# Patient Record
Sex: Female | Born: 1975 | Race: Black or African American | Hispanic: No | Marital: Single | Smoking: Never smoker
Health system: Southern US, Community
[De-identification: ages and names within clinical notes are randomized; demographics above are authoritative.]

## PROBLEM LIST (undated history)

## (undated) DIAGNOSIS — A749 Chlamydial infection, unspecified: Secondary | ICD-10-CM

## (undated) DIAGNOSIS — Z789 Other specified health status: Secondary | ICD-10-CM

## (undated) DIAGNOSIS — J45909 Unspecified asthma, uncomplicated: Secondary | ICD-10-CM

---

## 1998-03-30 ENCOUNTER — Inpatient Hospital Stay (HOSPITAL_COMMUNITY): Admission: AD | Admit: 1998-03-30 | Discharge: 1998-03-30 | Payer: Self-pay | Admitting: Obstetrics

## 1998-06-29 ENCOUNTER — Inpatient Hospital Stay (HOSPITAL_COMMUNITY): Admission: AD | Admit: 1998-06-29 | Discharge: 1998-06-29 | Payer: Self-pay | Admitting: Obstetrics

## 1999-09-06 ENCOUNTER — Encounter: Payer: Self-pay | Admitting: *Deleted

## 1999-09-06 ENCOUNTER — Ambulatory Visit (HOSPITAL_COMMUNITY): Admission: RE | Admit: 1999-09-06 | Discharge: 1999-09-06 | Payer: Self-pay | Admitting: *Deleted

## 1999-12-28 ENCOUNTER — Inpatient Hospital Stay (HOSPITAL_COMMUNITY): Admission: AD | Admit: 1999-12-28 | Discharge: 1999-12-28 | Payer: Self-pay | Admitting: *Deleted

## 2000-01-20 ENCOUNTER — Inpatient Hospital Stay (HOSPITAL_COMMUNITY): Admission: AD | Admit: 2000-01-20 | Discharge: 2000-01-22 | Payer: Self-pay | Admitting: *Deleted

## 2000-01-20 ENCOUNTER — Inpatient Hospital Stay (HOSPITAL_COMMUNITY): Admission: AD | Admit: 2000-01-20 | Discharge: 2000-01-20 | Payer: Self-pay | Admitting: *Deleted

## 2000-07-15 ENCOUNTER — Emergency Department (HOSPITAL_COMMUNITY): Admission: EM | Admit: 2000-07-15 | Discharge: 2000-07-15 | Payer: Self-pay | Admitting: Emergency Medicine

## 2000-10-21 ENCOUNTER — Encounter: Payer: Self-pay | Admitting: Obstetrics

## 2000-10-21 ENCOUNTER — Inpatient Hospital Stay (HOSPITAL_COMMUNITY): Admission: AD | Admit: 2000-10-21 | Discharge: 2000-10-21 | Payer: Self-pay | Admitting: Obstetrics

## 2000-11-19 ENCOUNTER — Inpatient Hospital Stay (HOSPITAL_COMMUNITY): Admission: AD | Admit: 2000-11-19 | Discharge: 2000-11-19 | Payer: Self-pay | Admitting: Obstetrics

## 2001-01-25 ENCOUNTER — Encounter: Payer: Self-pay | Admitting: Obstetrics

## 2001-01-25 ENCOUNTER — Inpatient Hospital Stay (HOSPITAL_COMMUNITY): Admission: AD | Admit: 2001-01-25 | Discharge: 2001-01-25 | Payer: Self-pay | Admitting: Obstetrics

## 2001-02-10 ENCOUNTER — Inpatient Hospital Stay (HOSPITAL_COMMUNITY): Admission: AD | Admit: 2001-02-10 | Discharge: 2001-02-10 | Payer: Self-pay | Admitting: Obstetrics

## 2001-03-03 ENCOUNTER — Inpatient Hospital Stay (HOSPITAL_COMMUNITY): Admission: AD | Admit: 2001-03-03 | Discharge: 2001-03-03 | Payer: Self-pay | Admitting: Obstetrics

## 2001-03-09 ENCOUNTER — Inpatient Hospital Stay (HOSPITAL_COMMUNITY): Admission: AD | Admit: 2001-03-09 | Discharge: 2001-03-09 | Payer: Self-pay | Admitting: Obstetrics

## 2001-03-10 ENCOUNTER — Encounter (INDEPENDENT_AMBULATORY_CARE_PROVIDER_SITE_OTHER): Payer: Self-pay | Admitting: Specialist

## 2001-03-10 ENCOUNTER — Inpatient Hospital Stay (HOSPITAL_COMMUNITY): Admission: AD | Admit: 2001-03-10 | Discharge: 2001-03-12 | Payer: Self-pay | Admitting: Obstetrics

## 2001-10-20 ENCOUNTER — Emergency Department (HOSPITAL_COMMUNITY): Admission: EM | Admit: 2001-10-20 | Discharge: 2001-10-20 | Payer: Self-pay | Admitting: Emergency Medicine

## 2002-12-13 ENCOUNTER — Encounter: Payer: Self-pay | Admitting: Family Medicine

## 2002-12-13 ENCOUNTER — Inpatient Hospital Stay (HOSPITAL_COMMUNITY): Admission: AD | Admit: 2002-12-13 | Discharge: 2002-12-13 | Payer: Self-pay | Admitting: Family Medicine

## 2003-03-25 ENCOUNTER — Inpatient Hospital Stay (HOSPITAL_COMMUNITY): Admission: AD | Admit: 2003-03-25 | Discharge: 2003-03-26 | Payer: Self-pay | Admitting: Obstetrics

## 2003-04-05 ENCOUNTER — Inpatient Hospital Stay (HOSPITAL_COMMUNITY): Admission: AD | Admit: 2003-04-05 | Discharge: 2003-04-05 | Payer: Self-pay | Admitting: Obstetrics

## 2003-04-07 ENCOUNTER — Inpatient Hospital Stay (HOSPITAL_COMMUNITY): Admission: AD | Admit: 2003-04-07 | Discharge: 2003-04-09 | Payer: Self-pay | Admitting: Obstetrics

## 2004-10-29 ENCOUNTER — Inpatient Hospital Stay (HOSPITAL_COMMUNITY): Admission: AD | Admit: 2004-10-29 | Discharge: 2004-10-29 | Payer: Self-pay | Admitting: *Deleted

## 2005-04-15 ENCOUNTER — Inpatient Hospital Stay (HOSPITAL_COMMUNITY): Admission: AD | Admit: 2005-04-15 | Discharge: 2005-04-15 | Payer: Self-pay | Admitting: *Deleted

## 2005-05-21 ENCOUNTER — Inpatient Hospital Stay (HOSPITAL_COMMUNITY): Admission: AD | Admit: 2005-05-21 | Discharge: 2005-05-21 | Payer: Self-pay | Admitting: *Deleted

## 2005-06-18 ENCOUNTER — Inpatient Hospital Stay (HOSPITAL_COMMUNITY): Admission: AD | Admit: 2005-06-18 | Discharge: 2005-06-18 | Payer: Self-pay | Admitting: *Deleted

## 2005-08-26 ENCOUNTER — Inpatient Hospital Stay (HOSPITAL_COMMUNITY): Admission: AD | Admit: 2005-08-26 | Discharge: 2005-08-30 | Payer: Self-pay | Admitting: Obstetrics

## 2005-08-29 ENCOUNTER — Encounter (INDEPENDENT_AMBULATORY_CARE_PROVIDER_SITE_OTHER): Payer: Self-pay | Admitting: Specialist

## 2006-04-09 HISTORY — PX: TUBAL LIGATION: SHX77

## 2006-07-24 ENCOUNTER — Inpatient Hospital Stay (HOSPITAL_COMMUNITY): Admission: AD | Admit: 2006-07-24 | Discharge: 2006-07-24 | Payer: Self-pay | Admitting: Obstetrics

## 2006-09-16 ENCOUNTER — Emergency Department (HOSPITAL_COMMUNITY): Admission: EM | Admit: 2006-09-16 | Discharge: 2006-09-16 | Payer: Self-pay | Admitting: Family Medicine

## 2006-09-23 ENCOUNTER — Ambulatory Visit: Payer: Self-pay | Admitting: Obstetrics and Gynecology

## 2006-09-23 ENCOUNTER — Inpatient Hospital Stay (HOSPITAL_COMMUNITY): Admission: AD | Admit: 2006-09-23 | Discharge: 2006-09-23 | Payer: Self-pay | Admitting: Family Medicine

## 2006-10-02 ENCOUNTER — Inpatient Hospital Stay (HOSPITAL_COMMUNITY): Admission: AD | Admit: 2006-10-02 | Discharge: 2006-10-02 | Payer: Self-pay | Admitting: Obstetrics

## 2006-10-06 ENCOUNTER — Inpatient Hospital Stay (HOSPITAL_COMMUNITY): Admission: AD | Admit: 2006-10-06 | Discharge: 2006-10-06 | Payer: Self-pay | Admitting: Obstetrics

## 2006-10-10 ENCOUNTER — Inpatient Hospital Stay (HOSPITAL_COMMUNITY): Admission: AD | Admit: 2006-10-10 | Discharge: 2006-10-10 | Payer: Self-pay | Admitting: Obstetrics

## 2006-10-10 ENCOUNTER — Ambulatory Visit (HOSPITAL_COMMUNITY): Admission: RE | Admit: 2006-10-10 | Discharge: 2006-10-10 | Payer: Self-pay | Admitting: Obstetrics

## 2006-10-12 ENCOUNTER — Inpatient Hospital Stay (HOSPITAL_COMMUNITY): Admission: AD | Admit: 2006-10-12 | Discharge: 2006-10-12 | Payer: Self-pay | Admitting: Obstetrics

## 2006-10-15 ENCOUNTER — Encounter: Payer: Self-pay | Admitting: Obstetrics

## 2006-10-15 ENCOUNTER — Inpatient Hospital Stay (HOSPITAL_COMMUNITY): Admission: AD | Admit: 2006-10-15 | Discharge: 2006-10-15 | Payer: Self-pay | Admitting: Obstetrics

## 2006-10-18 ENCOUNTER — Inpatient Hospital Stay (HOSPITAL_COMMUNITY): Admission: AD | Admit: 2006-10-18 | Discharge: 2006-10-18 | Payer: Self-pay | Admitting: Obstetrics and Gynecology

## 2006-10-21 ENCOUNTER — Inpatient Hospital Stay (HOSPITAL_COMMUNITY): Admission: AD | Admit: 2006-10-21 | Discharge: 2006-10-25 | Payer: Self-pay | Admitting: Obstetrics and Gynecology

## 2006-10-22 ENCOUNTER — Encounter (INDEPENDENT_AMBULATORY_CARE_PROVIDER_SITE_OTHER): Payer: Self-pay | Admitting: Obstetrics

## 2010-04-30 ENCOUNTER — Encounter: Payer: Self-pay | Admitting: Obstetrics

## 2010-08-22 NOTE — Op Note (Signed)
[name], Madison Castillo NO.:  [id number]   MEDICAL RECORD NO.:  [id number]          PATIENT TYPE:  INP   LOCATION:  9124                          [facility]ILITY:  [facility]   PHYSICIAN:  Kathreen Cosier, M.D.DATE OF BIRTH:  November 11, 1975   DATE OF PROCEDURE:  10/22/2006  DATE OF DISCHARGE:                               OPERATIVE REPORT   PREOPERATIVE DIAGNOSIS:  Normal vaginal delivery of twin A and C-section  for delivery of twin B.   SURGEON:  Dr. Gaynell Face   ANESTHESIA:  Epidural.   FIRST ASSISTANT:  Dr. Clearance Coots.   PROCEDURE:  The patient placed on the operating table in supine  position.  Abdomen prepped and draped, bladder emptied with Foley  catheter.  Transverse suprapubic incision made carried down to rectus  fascia. Fascia cleanly incised length of incision.  Recti muscles  retracted laterally.  Peritoneum incised longitudinally.  Transverse  incision made in the visceral peritoneum above the bladder mobilized  inferiorly.  Transverse lower uterine incision made and the patient was  delivered from vertex. The head was hyperextended and she had a female  Apgars 7 and 8 weighing 5 pounds 11 ounces at 10:30 a.m.  Twin A  delivered at 9:04 a.m. vaginally.  Placenta was removed and sent to  pathology.  There appeared to be an abruption of placenta.  The team was  in attendance and fluid was clear.  Uterine cavity cleaned with dry  laps.  Uterine incision closed in one layer with continuous suture of #1  chromic.  Hemostasis satisfactory.  Bladder flap reattached 2-0 chromic.  The right tube grasped in midportion with Babcock clamps and 0 plain  suture placed in the mesosalpinx below the portion of tube within clamp.  This was tied and approximately 1 inch tube transected.  The procedure  done in a similar fashion on other side.  Lap and sponge counts correct.  Blood loss 600 mL.  Abdomen closed in layers, peritoneum continuous  suture of 0 chromic, fascia continuous  suture of 0 Dexon and the  subcutaneous tissue closed with to 3-0 plain. Skin closed with staples.  The patient tolerated the procedure well and taken to recovery room in  good condition.           ______________________________  Kathreen Cosier, M.D.     BAM/MEDQ  D:  10/22/2006  T:  10/22/2006  Job:  [id number]

## 2010-08-22 NOTE — H&P (Signed)
NAMEEMMELY, BITTINGER                ACCOUNT NO.:  [id number]   MEDICAL RECORD NO.:  [id number]          PATIENT TYPE:  INP   LOCATION:  9164                          [facility]  [facility]   PHYSICIAN:  Roseanna Rainbow, M.D.DATE OF BIRTH:  Sep 30, 1975   DATE OF ADMISSION:  10/21/2006  DATE OF DISCHARGE:                              HISTORY & PHYSICAL   CHIEF COMPLAINT:  The patient is a 35 year old gravida 7, para 5 with an  estimated date of confinement of November 16, 2006 with a twin gestation at  36+ weeks, complaining of contractions and possible rupture of  membranes.   HISTORY OF PRESENT ILLNESS:  Please see the above.   PAST GYNECOLOGICAL HISTORY:  Noncontributory.   PAST OBSTETRICAL HISTORY:  1. In 1995, she was delivered of a liveborn female, 7 pounds 5 ounces,      full-term vaginal delivery, no complications.  2. In 1996, she was delivered of a 5-pound 2-ounce infant, full-term      vaginal delivery.  In 2001, she was delivered of a 7-pound 3-once      infant, vaginal delivery, no complications.  3. In 2002, she had a twin gestation, both 5-pound 2-ounce infants, a      vaginal delivery.  4. In 2007, she had a second trimester spontaneous abortion.   PAST MEDICAL HISTORY:  She denies.   FAMILY HISTORY:  Noncontributory.   PAST SURGICAL HISTORY:  She denies.   SOCIAL HISTORY:  She is single.  She gives a history of tobacco use, but  discontinued during the pregnancy.  She denies any alcohol or substance  abuse.   OBSTETRICAL RISK FACTORS:  Twin gestation, obesity, GBS positive,  history of IUFD and preterm deliveries.   PRENATAL SCREENS:  Blood type B-positive, antibody screen negative.  RPR  nonreactive.  Rubella immune.  Hepatitis B surface antigen negative.  GBS positive.  HIV nonreactive.   On ultrasound today, the twins are vertex/vertex presentation with  normal amniotic fluid indices x2.  On ultrasound on July 3 at 34+ weeks,  fetus A was the 24th percentile  for estimated fetal weight; fetus B was  56th percentile; they are dichorionic female/female.   PHYSICAL EXAMINATION:  VITAL SIGNS:  Blood pressure is 140s to 150s over  70s to 90s.  Fetal heart tracings reassuring x2.  Tocodynamometer:  Irregular uterine contractions.  GENERAL:  No acute distress.  ABDOMEN:  Gravid.  PELVIC:  On sterile vaginal exam, cervix is 3- to 4-cm dilated, 50%  effaced.   LABORATORY DATA:  Remarkable for a uric acid of 7.2.   ASSESSMENT:  Twin gestation at 30 weeks with rule out pregnancy-induced  hypertension, prodromal labor, fetal heart tracings consistent with  fetal well-being.   PLAN:  Admission, expectant management for now, possible augmentation of  labor.      Roseanna Rainbow, M.D.  Electronically Signed     LAJ/MEDQ  D:  10/22/2006  T:  10/22/2006  Job:  [id number]

## 2010-08-22 NOTE — H&P (Signed)
NAMERIA, REDCAY NO.:  [id number]   MEDICAL RECORD NO.:  [id number]          PATIENT TYPE:  INP   LOCATION:  9124                          [facility]  [facility]   PHYSICIAN:  Kathreen Cosier, M.D.DATE OF BIRTH:  1976/02/06   DATE OF ADMISSION:  10/21/2006  DATE OF DISCHARGE:                              HISTORY & PHYSICAL   HISTORY OF PRESENT ILLNESS:  The patient is a 35 year old gravida 7,  para 2-3-1-5 , pregnant with twins. [facility] November 16, 2006. Very late  prenatal care. She came in in labor, 4 cm, 100%, then contracting  irregularly. By 7:00 a.m. on October 21, 2005, cervix was 6 cm, 100%. Twin  A vertex, minus 2 station. Membranes ruptured artificially. Fluid was  clear. Twin B by recent ultrasound was also vertex. While hospitalized,  her diastolic blood pressures ranged between 89 and 93. Uric acid was 7+  and she has had 4+ edema, 30 of protein, 1+ reflexes. She was started on  magnesium sulfate on October 22, 2006 at 7:00 a.m. She progressed rapidly  and had a normal vaginal delivery of twin A at 9:04 a.m., 4 pounds 11  ounces, with Apgar's of 9 and 9. Twin B was a vertex and the vertex at  minus 2 station. Membranes ruptured artificially. Fluid clear. Fetal  heart remained normal. She tried to push for 4 to 5 minutes. There was  no descent between below minus 2 station. The patient refused to push  and no descent at vertex below minus 2 station. She demanded C-section  and she was also scheduled for tubal ligation.   PHYSICAL EXAMINATION:  GENERAL:  Revealed a massively obese female in  labor.  HEENT:  Negative.  LUNGS:  Clear.  HEART:  Regular rhythm. No murmur, rub, or gallop present.  ABDOMEN:  No masses. Distended with twin gestation.  EXTREMITIES:  4+ edema.           ______________________________  Kathreen Cosier, M.D.     BAM/MEDQ  D:  10/22/2006  T:  10/22/2006  Job:  [id number]

## 2010-08-25 NOTE — Discharge Summary (Signed)
[name], Madison Castillo                ACCOUNT NO.:  [id number]   MEDICAL RECORD NO.:  [id number]          PATIENT TYPE:  INP   LOCATION:  9304                          [facility]   PHYSICIAN:  Kathreen Cosier, M.D.DATE OF BIRTH:  [birth year]   DATE OF ADMISSION:  08/26/2005  DATE OF DISCHARGE:  08/30/2005                                 DISCHARGE SUMMARY   Patient is a 35 year old gravida 6, para 2-3-0-5 [redacted] weeks gestation by  ultrasound.  On examination her uterus is 22 weeks size and she was admitted  with premature contractions and a cervix 1 cm, 80% with membranes intact.  Because of the size of the uterus an attempt was made to stop her  contractions.  She was placed on magnesium sulfate and ampicillin 2 g IV  every six hours.  However, by May 22 she started having foul smelling lochia  and passed the fetus on May 23.  The placenta was retained and 400 mcg of  Cytotec was placed in the vagina.  The placenta was removed intact from the  cervix digitally and post delivery hemoglobin was 8.4.  She was discharged  home on May 24 on Motrin 800 t.i.d., ampicillin, and iron and she had a  second trimester abortion of a female weighing 8 ounces and the Apgars were  1, 1, and 1.           ______________________________  Kathreen Cosier, M.D.     BAM/MEDQ  D:  09/19/2005  T:  09/19/2005  Job:  [id number]

## 2010-08-25 NOTE — Discharge Summary (Signed)
Madison Castillo, Madison Castillo NO.:  [id number]   MEDICAL RECORD NO.:  [id number]          PATIENT TYPE:  INP   LOCATION:  9124                          [facility]   PHYSICIAN:  Kathreen Cosier, M.D.DATE OF BIRTH:  06-23-1975   DATE OF ADMISSION:  10/21/2006  DATE OF DISCHARGE:  10/25/2006                               DISCHARGE SUMMARY   This is a 35 year old gravida 7, para 2-2-1-5, EDC of 11/16/06.  Late  prenatal care with twins, in labor.  Cervix 6 cm, 100%.  Twin A vertex,  -2.  Membranes ruptured artificially, fluid clear.  Twin B was a vertex  on ultrasound prior to admission.  Diastolic blood pressures were 04-54.  Uric acid 7+.  She was started on magnesium sulfate 4 grams, lower to 2  grams an hour.  She had 4+ edema and 1+ reflexes.   Twin A delivered normal vaginal delivery, female, Apgar 9/9, 4 pounds 11  ounces.  Twin B was a vertex.  The patient pushed for 45 minutes with no  descent, low -2 station.  Then the patient decided she was not going to  push any further and demanded a C-section.  She underwent a  low  transverse cesarean section for Twin B, had a female, Apgars 7/8, weighing  5 pounds 11 ounces.  The head was hyperextended.   Postoperatively she did well.  Her hemoglobin was 7.  She was placed on  hydrochlorothiazide 25 mg by mouth daily for her edema.  She was  asymptomatic with her hemoglobin of 7, and she was placed on ferrous  sulfate twice a day.  She was discharged home on the third postoperative  day on Tylox for pain and ferrous sulfate 1 by mouth twice a day.   DISCHARGE DIAGNOSES:  1. Status post intrauterine pregnancy, twin gestation.  Twin A,      vaginal; Twin B, cesarean section.  2. Tubal ligation.           ______________________________  Kathreen Cosier, M.D.     BAM/MEDQ  D:  11/20/2006  T:  11/21/2006  Job:  [id number]

## 2010-10-23 ENCOUNTER — Encounter (HOSPITAL_COMMUNITY): Payer: Self-pay | Admitting: *Deleted

## 2010-10-23 ENCOUNTER — Inpatient Hospital Stay (HOSPITAL_COMMUNITY)
Admission: AD | Admit: 2010-10-23 | Discharge: 2010-10-23 | Disposition: A | Discharge status: Home / Self Care | Payer: Self-pay | Source: Ambulatory Visit | Attending: Obstetrics & Gynecology | Admitting: Obstetrics & Gynecology

## 2010-10-23 DIAGNOSIS — N926 Irregular menstruation, unspecified: Secondary | ICD-10-CM

## 2010-10-23 DIAGNOSIS — R110 Nausea: Secondary | ICD-10-CM | POA: Insufficient documentation

## 2010-10-23 HISTORY — DX: Chlamydial infection, unspecified: A74.9

## 2010-10-23 LAB — URINALYSIS, ROUTINE W REFLEX MICROSCOPIC
Bilirubin Urine: NEGATIVE
Ketones, ur: NEGATIVE mg/dL
Nitrite: NEGATIVE
Protein, ur: NEGATIVE mg/dL
Specific Gravity, Urine: >1.03 — ABNORMAL HIGH (ref 1.005–1.030)
Urobilinogen, UA: 0.2 mg/dL (ref 0.0–1.0)

## 2010-10-23 MED ORDER — PROMETHAZINE HCL 25 MG PO TABS: 25.0000 mg | ORAL_TABLET | Freq: Four times a day (QID) | ORAL | Status: AC | PRN

## 2010-10-23 NOTE — ED Provider Notes (Addendum)
History    patient is a 35 year old black female is a gravida 7 para 6 AB 1. She presents today complaining of nausea that comes and goes for the past 3 weeks. She also complains of a history of irregular menses. She states she has not had a regular menses in 2 months. She denies any abdominal pain. She denies vaginal discharge, bleeding, fever or any other problems at this time. She states she was mainly concerned that she could be pregnant. She does have a history of bilateral tubal ligation.  Chief Complaint  Patient presents with  . Nausea   HPI  OB History    Grav Para Term Preterm Abortions TAB SAB Ect Mult Living   7 6 5 1 1  0 1 0 1 7      Past Medical History  Diagnosis Date  . Chlamydia     Past Surgical History  Procedure Date  . Cesarean section     Family History  Problem Relation Age of Onset  . Hypertension Mother   . Diabetes Mother   . Hypertension Maternal Grandmother   . Heart disease Maternal Grandmother   . Hypertension Maternal Grandfather   . Heart disease Maternal Grandfather     History  Substance Use Topics  . Smoking status: Former Smoker    Quit date: 01/14/2005  . Smokeless tobacco: Never Used  . Alcohol Use: No    Allergies: No Known Allergies  Prescriptions prior to admission  Medication Sig Dispense Refill  . acetaminophen (TYLENOL) 500 MG tablet Take 1,000 mg by mouth daily as needed. Patient stopped taking the BC's and started taking Tylenol for headaches.       . Aspirin-Salicylamide-Caffeine (BC HEADACHE POWDER PO) Take 2 packets by mouth daily as needed. Patient uses medication for headaches.         Review of Systems  Constitutional: Negative for fever, chills and malaise/fatigue.  Cardiovascular: Negative for chest pain and palpitations.  Gastrointestinal: Positive for nausea. Negative for vomiting, abdominal pain, diarrhea and constipation.  Genitourinary: Negative for dysuria, urgency, frequency, hematuria and flank pain.    Neurological: Negative for dizziness and headaches.  Psychiatric/Behavioral: Negative for depression and suicidal ideas.   Physical Exam   Blood pressure 124/96, pulse 76, temperature 98.3 F (36.8 C), resp. rate 20, height 5\' 3"  (1.6 m), weight 322 lb 3.2 oz (146.149 kg), last menstrual period 07/27/2010, SpO2 97.00%.  Physical Exam  Constitutional: She is oriented to person, place, and time. She appears well-developed and well-nourished. No distress.  HENT:  Head: Normocephalic and atraumatic.  Eyes: EOM are normal. Pupils are equal, round, and reactive to light.  Cardiovascular: Normal rate and regular rhythm.  Exam reveals no gallop and no friction rub.   No murmur heard. Respiratory: Effort normal and breath sounds normal. No respiratory distress. She has no wheezes. She has no rales. She exhibits no tenderness.  GI: Soft. She exhibits no distension. There is no tenderness. There is no rebound and no guarding.  Neurological: She is alert and oriented to person, place, and time.  Skin: Skin is warm and dry. She is not diaphoretic.  Psychiatric: She has a normal mood and affect. Her behavior is normal. Judgment and thought content normal.    MAU Course  Procedures  Results for orders placed during the hospital encounter of 10/23/10 (from the past 24 hour(s))  URINALYSIS, ROUTINE W REFLEX MICROSCOPIC     Status: Abnormal   Collection Time   10/23/10  2:05 PM  Component Value Range   Color, Urine YELLOW  YELLOW    Appearance HAZY (*) CLEAR    Specific Gravity, Urine >1.030 (*) 1.005 - 1.030    pH 6.0  5.0 - 8.0    Glucose, UA NEGATIVE  NEGATIVE (mg/dL)   Hgb urine dipstick NEGATIVE  NEGATIVE    Bilirubin Urine NEGATIVE  NEGATIVE    Ketones, ur NEGATIVE  NEGATIVE (mg/dL)   Protein, ur NEGATIVE  NEGATIVE (mg/dL)   Urobilinogen, UA 0.2  0.0 - 1.0 (mg/dL)   Nitrite NEGATIVE  NEGATIVE    Leukocytes, UA SMALL (*) NEGATIVE   URINE MICROSCOPIC-ADD ON     Status: Abnormal    Collection Time   10/23/10  2:05 PM      Component Value Range   Squamous Epithelial / LPF MANY (*) RARE    WBC, UA 11-20  <3 (WBC/hpf)   Urine-Other MUCOUS PRESENT    POCT PREGNANCY, URINE     Status: Normal   Collection Time   10/23/10  2:25 PM      Component Value Range   Preg Test, Ur NEGATIVE      Assessment and plan: 1) nausea: We'll give the patient a prescription for Phenergan to use when necessary. She will followup with her PCP. I did discuss with her appropriate diet, activities, risks, and precautions. She had no other questions or problems at this time.  Clinton Gallant. Rice III, DrHSc, MPAS, PA-C

## 2010-10-23 NOTE — Progress Notes (Signed)
Been nauseated off and on last 3 wks.  No period in 2+months. Feel week, sleeping too much.

## 2010-10-23 NOTE — Progress Notes (Signed)
Pt states she has been having nausea in the morning and evening for about 3 weeks. Has not had a period in 2 1/2 months. Had a BTL during her Cesarean Section delivery of her last child 4 years ago.

## 2011-01-22 LAB — CBC
MCHC: 33.8
MCV: 78.2
Platelets: 192
RDW: 12.9

## 2011-01-23 LAB — URINALYSIS, ROUTINE W REFLEX MICROSCOPIC
Glucose, UA: NEGATIVE
Glucose, UA: NEGATIVE
Hgb urine dipstick: NEGATIVE
Ketones, ur: NEGATIVE
Leukocytes, UA: NEGATIVE
Nitrite: NEGATIVE
Protein, ur: 30 — AB
Protein, ur: 30 — AB
Specific Gravity, Urine: 1.02
Specific Gravity, Urine: <1.005 — ABNORMAL LOW
Urobilinogen, UA: 1
pH: 6
pH: 6

## 2011-01-23 LAB — TYPE AND SCREEN: ABO/RH(D): B POS

## 2011-01-23 LAB — DIFFERENTIAL
Basophils Relative: 1
Eosinophils Absolute: 0
Eosinophils Relative: 1
Lymphs Abs: 1.3
Monocytes Absolute: 0.2
Neutrophils Relative %: 61

## 2011-01-23 LAB — COMPREHENSIVE METABOLIC PANEL
BUN: 2 — ABNORMAL LOW
Calcium: 8.4
Glucose, Bld: 86
Sodium: 139
Total Protein: 5.4 — ABNORMAL LOW

## 2011-01-23 LAB — CBC
Hemoglobin: 9.2 — ABNORMAL LOW
MCHC: 32.7
MCHC: 32.9
MCV: 80.6
Platelets: 259
RBC: 3.86 — ABNORMAL LOW
RDW: 12.7
RDW: 12.9

## 2011-01-23 LAB — URINE MICROSCOPIC-ADD ON

## 2011-01-23 LAB — SICKLE CELL SCREEN: Sickle Cell Screen: NEGATIVE

## 2011-01-23 LAB — LACTATE DEHYDROGENASE: LDH: 238

## 2011-01-23 LAB — WET PREP, GENITAL
Clue Cells Wet Prep HPF POC: NONE SEEN
Yeast Wet Prep HPF POC: NONE SEEN

## 2011-01-23 LAB — URIC ACID: Uric Acid, Serum: 7.2 — ABNORMAL HIGH

## 2011-01-24 LAB — URINE CULTURE

## 2011-01-24 LAB — CBC
HCT: 28.9 — ABNORMAL LOW
Hemoglobin: 9.6 — ABNORMAL LOW
MCHC: 33.3
RDW: 12.8

## 2011-01-24 LAB — URINE MICROSCOPIC-ADD ON

## 2011-01-24 LAB — RAPID HIV SCREEN (WH-MAU): Rapid HIV Screen: NONREACTIVE

## 2011-01-24 LAB — URINALYSIS, ROUTINE W REFLEX MICROSCOPIC
Bilirubin Urine: NEGATIVE
Glucose, UA: NEGATIVE
Hgb urine dipstick: NEGATIVE
Nitrite: NEGATIVE
Protein, ur: NEGATIVE
Specific Gravity, Urine: 1.01
Specific Gravity, Urine: <1.005 — ABNORMAL LOW
Urobilinogen, UA: 0.2
Urobilinogen, UA: 1
pH: 6.5

## 2011-01-24 LAB — COMPREHENSIVE METABOLIC PANEL
AST: 26
CO2: 31
Calcium: 8.5
Creatinine, Ser: 0.48
GFR calc Af Amer: >60
GFR calc non Af Amer: >60

## 2011-01-24 LAB — DIFFERENTIAL
Basophils Absolute: 0
Basophils Relative: 0
Eosinophils Relative: 2
Lymphocytes Relative: 24
Monocytes Absolute: 0.4

## 2011-01-24 LAB — LACTATE DEHYDROGENASE: LDH: 159

## 2011-01-24 LAB — RAPID URINE DRUG SCREEN, HOSP PERFORMED
Amphetamines: NOT DETECTED
Benzodiazepines: NOT DETECTED

## 2011-01-24 LAB — SICKLE CELL SCREEN: Sickle Cell Screen: NEGATIVE

## 2011-01-24 LAB — GC/CHLAMYDIA PROBE AMP, GENITAL
Chlamydia, DNA Probe: POSITIVE — AB
GC Probe Amp, Genital: NEGATIVE

## 2012-03-03 ENCOUNTER — Inpatient Hospital Stay (HOSPITAL_COMMUNITY): Payer: Self-pay

## 2012-03-03 ENCOUNTER — Inpatient Hospital Stay (HOSPITAL_COMMUNITY)
Admission: AD | Admit: 2012-03-03 | Discharge: 2012-03-03 | Disposition: A | Discharge status: Home / Self Care | Payer: Self-pay | Source: Ambulatory Visit | Attending: Obstetrics | Admitting: Obstetrics

## 2012-03-03 ENCOUNTER — Encounter (HOSPITAL_COMMUNITY): Payer: Self-pay

## 2012-03-03 DIAGNOSIS — N898 Other specified noninflammatory disorders of vagina: Secondary | ICD-10-CM

## 2012-03-03 DIAGNOSIS — R109 Unspecified abdominal pain: Secondary | ICD-10-CM | POA: Insufficient documentation

## 2012-03-03 DIAGNOSIS — N938 Other specified abnormal uterine and vaginal bleeding: Secondary | ICD-10-CM | POA: Insufficient documentation

## 2012-03-03 DIAGNOSIS — N939 Abnormal uterine and vaginal bleeding, unspecified: Secondary | ICD-10-CM

## 2012-03-03 DIAGNOSIS — N949 Unspecified condition associated with female genital organs and menstrual cycle: Secondary | ICD-10-CM | POA: Insufficient documentation

## 2012-03-03 HISTORY — DX: Other specified health status: Z78.9

## 2012-03-03 LAB — URINALYSIS, ROUTINE W REFLEX MICROSCOPIC
Bilirubin Urine: NEGATIVE
Nitrite: NEGATIVE
Specific Gravity, Urine: <1.005 — ABNORMAL LOW (ref 1.005–1.030)
pH: 6 (ref 5.0–8.0)

## 2012-03-03 LAB — WET PREP, GENITAL

## 2012-03-03 LAB — URINE MICROSCOPIC-ADD ON

## 2012-03-03 LAB — CBC
MCV: 82.3 fL (ref 78.0–100.0)
Platelets: 243 10*3/uL (ref 150–400)
RBC: 4.35 MIL/uL (ref 3.87–5.11)
WBC: 5.3 10*3/uL (ref 4.0–10.5)

## 2012-03-03 LAB — POCT PREGNANCY, URINE: Preg Test, Ur: NEGATIVE

## 2012-03-03 MED ORDER — MEDROXYPROGESTERONE ACETATE 10 MG PO TABS: 10.0000 mg | ORAL_TABLET | Freq: Every day | ORAL | Status: DC

## 2012-03-03 MED ORDER — KETOROLAC TROMETHAMINE 60 MG/2ML IM SOLN
60.0000 mg | Freq: Once | INTRAMUSCULAR | Status: AC
  Administered 2012-03-03: 60 mg via INTRAMUSCULAR
  Filled 2012-03-03: qty 2

## 2012-03-03 MED ORDER — IBUPROFEN 800 MG PO TABS: 800.0000 mg | ORAL_TABLET | Freq: Three times a day (TID) | ORAL | Status: DC

## 2012-03-03 NOTE — MAU Note (Signed)
Pt states that she has been having vaginal bleeding with clots for about 3 weeks now. Pt states she is having some abdominal pain as well and has taken multiple medications to help with the pain, all of which none have helped.

## 2012-03-03 NOTE — MAU Provider Note (Signed)
History     CSN: [id number]  Arrival date and time: 03/03/12 1919   None     Chief Complaint  Patient presents with  . Vaginal Bleeding   HPI Madison SESTO is a 36 y.o. female who presents to MAU with vaginal bleeding. The bleeding started over 3 weeks ago.  She describes the bleeding as starting as a period but has increased. Using maxi pad every 30 minutes for the past few days. Passing clots. Associated symptoms include headache, light headed and abdominal cramping that she rates 5/10. Taking Advil and Aleve without results. LNMP 10/02/11 after that no period until bleeding started 3 weeks ago. Never had irregular periods before.  BTL for birth control.  Last pap smear over one year ago and was normal. Current sex partner x 2 years but recently separated. Hx of Chlamydia years ago.    OB History    Grav Para Term Preterm Abortions TAB SAB Ect Mult Living   8 7 3 4 1  1   7       Past Medical History  Diagnosis Date  . No pertinent past medical history     Past Surgical History  Procedure Date  . Cesarean section   . Tubal ligation 2008    Family History  Problem Relation Age of Onset  . Other Neg Hx     History  Substance Use Topics  . Smoking status: Never Smoker   . Smokeless tobacco: Not on file  . Alcohol Use: Yes     Comment: occassional at family functions    Allergies: No Known Allergies  Prescriptions prior to admission  Medication Sig Dispense Refill  . ibuprofen (ADVIL,MOTRIN) 100 MG tablet Take 100 mg by mouth every 6 (six) hours as needed. pain        Review of Systems  Constitutional: Positive for malaise/fatigue. Negative for fever, chills and weight loss.  HENT: Positive for congestion. Negative for ear pain, nosebleeds, sore throat and neck pain.   Eyes: Negative for blurred vision, double vision, photophobia and pain.  Respiratory: Negative for cough, shortness of breath and wheezing.   Cardiovascular: Positive for leg swelling. Negative  for chest pain and palpitations.  Gastrointestinal: Positive for abdominal pain. Negative for heartburn, nausea, vomiting, diarrhea and constipation.  Genitourinary: Negative for dysuria, urgency and frequency.       Vaginal bleeding  Musculoskeletal: Negative for myalgias and back pain.  Skin: Negative for itching and rash.  Neurological: Positive for dizziness and headaches. Negative for sensory change, speech change, seizures and weakness.  Endo/Heme/Allergies:       Hx of DVT left calf.  Psychiatric/Behavioral: Negative for depression and substance abuse. The patient is not nervous/anxious and does not have insomnia.    Physical Exam   Blood pressure 129/84, pulse 93, temperature 97.5 F (36.4 C), temperature source Oral, resp. rate 18, height 5\' 4"  (1.626 m), weight 334 lb (151.501 kg).  Physical Exam  Nursing note and vitals reviewed. Constitutional: She is oriented to person, place, and time. No distress.       Obese A/A female  HENT:  Head: Normocephalic and atraumatic.  Eyes: EOM are normal.  Neck: Neck supple.  Cardiovascular: Normal rate.   Respiratory: Effort normal.  GI: Soft. There is no tenderness.  Musculoskeletal: Normal range of motion.  Neurological: She is alert and oriented to person, place, and time.  Skin: Skin is warm and dry.  Psychiatric: She has a normal mood and affect. Her behavior  is normal. Judgment and thought content normal.    MAU Course  Procedures US Transvaginal Non-ob  03/03/2012  *RADIOLOGY REPORT*  Clinical Data: Pelvic pain and heavy vaginal bleeding for 3 weeks.  TRANSABDOMINAL AND TRANSVAGINAL ULTRASOUND OF PELVIS Technique:  Both transabdominal and transvaginal ultrasound examinations of the pelvis were performed. Transabdominal technique was performed for global imaging of the pelvis including uterus, ovaries, adnexal regions, and pelvic cul-de-sac.  It was necessary to proceed with endovaginal exam following the transabdominal exam to  visualize the uterus and ovaries in greater detail.  Comparison:  Prior ultrasound of pregnancy performed 10/22/2006  Findings:  Uterus: Normal in size and appearance; measures 9.8 x 3.8 x 3.6 cm. Nabothian cysts are seen at the cervix, measuring up to 1.1 cm in size.  Endometrium: Grossly normal in thickness and appearance, though difficult to fully characterize; measures 1.0 cm in thickness.  Right ovary:  Normal appearance/no adnexal mass; measures 2.7 x 1.7 x 1.9 cm.  Left ovary: Normal appearance/no adnexal mass; measures 2.0 x 1.2 x 1.8 cm.  Other findings: No free fluid seen within the pelvic cul-de-sac.  IMPRESSION: Normal study.  No definite evidence of pelvic mass or other significant abnormality. Endometrium difficult to fully characterize, possibly reflecting a small amount of clot within the endometrial canal.  If symptoms persist, further evaluation would be warranted.   Original Report Authenticated By: Tonia Ghent, M.D.    US Pelvis Complete  03/03/2012  *RADIOLOGY REPORT*  Clinical Data: Pelvic pain and heavy vaginal bleeding for 3 weeks.  TRANSABDOMINAL AND TRANSVAGINAL ULTRASOUND OF PELVIS Technique:  Both transabdominal and transvaginal ultrasound examinations of the pelvis were performed. Transabdominal technique was performed for global imaging of the pelvis including uterus, ovaries, adnexal regions, and pelvic cul-de-sac.  It was necessary to proceed with endovaginal exam following the transabdominal exam to visualize the uterus and ovaries in greater detail.  Comparison:  Prior ultrasound of pregnancy performed 10/22/2006  Findings:  Uterus: Normal in size and appearance; measures 9.8 x 3.8 x 3.6 cm. Nabothian cysts are seen at the cervix, measuring up to 1.1 cm in size.  Endometrium: Grossly normal in thickness and appearance, though difficult to fully characterize; measures 1.0 cm in thickness.  Right ovary:  Normal appearance/no adnexal mass; measures 2.7 x 1.7 x 1.9 cm.  Left  ovary: Normal appearance/no adnexal mass; measures 2.0 x 1.2 x 1.8 cm.  Other findings: No free fluid seen within the pelvic cul-de-sac.  IMPRESSION: Normal study.  No definite evidence of pelvic mass or other significant abnormality. Endometrium difficult to fully characterize, possibly reflecting a small amount of clot within the endometrial canal.  If symptoms persist, further evaluation would be warranted.   Original Report Authenticated By: Tonia Ghent, M.D.     Assessment: 36 y.o. female with vaginal bleeding   DUB  Plan:  Discussed with Dr. Tamela Oddi on call for Dr. Gaynell Face   Rx Provera and follow up in the office    Discussed with the patient and all questioned fully answered. She will return if any problems arise.   Medication List     As of 03/04/2012 11:11 PM    START taking these medications         medroxyPROGESTERone 10 MG tablet   Commonly known as: PROVERA   Take 1 tablet (10 mg total) by mouth daily.      CHANGE how you take these medications         ibuprofen 800 MG tablet  Commonly known as: ADVIL,MOTRIN   Take 1 tablet (800 mg total) by mouth 3 (three) times daily.   What changed: - medication strength - dose - how often to take the med - reasons to take the med - doctor's instructions          Where to get your medications    These are the prescriptions that you need to pick up. We sent them to a specific pharmacy, so you will need to go there to get them.   [facility] [address] [address],  - [address] AT [facility] [address]    [address] [place] [address]    Phone: [phone]    Hours: 24-hours        ibuprofen 800 MG tablet   medroxyPROGESTERone 10 MG tablet            Dinara Lupu, RN, FNP, BC 03/03/2012, 8:09 PM

## 2012-03-04 LAB — GC/CHLAMYDIA PROBE AMP, GENITAL: GC Probe Amp, Genital: NEGATIVE

## 2012-03-21 ENCOUNTER — Encounter (HOSPITAL_COMMUNITY): Payer: Self-pay

## 2012-06-11 ENCOUNTER — Telehealth: Payer: Self-pay | Admitting: Advanced Practice Midwife

## 2012-06-11 NOTE — Telephone Encounter (Signed)
See Telephone call document

## 2012-06-12 ENCOUNTER — Encounter: Payer: Self-pay | Admitting: Obstetrics and Gynecology

## 2012-07-01 ENCOUNTER — Inpatient Hospital Stay (HOSPITAL_COMMUNITY)
Admission: AD | Admit: 2012-07-01 | Discharge: 2012-07-01 | Disposition: A | Discharge status: Home / Self Care | Payer: Self-pay | Source: Ambulatory Visit | Attending: Family Medicine | Admitting: Family Medicine

## 2012-07-01 ENCOUNTER — Encounter (HOSPITAL_COMMUNITY): Payer: Self-pay | Admitting: *Deleted

## 2012-07-01 DIAGNOSIS — N949 Unspecified condition associated with female genital organs and menstrual cycle: Secondary | ICD-10-CM

## 2012-07-01 DIAGNOSIS — D259 Leiomyoma of uterus, unspecified: Secondary | ICD-10-CM | POA: Insufficient documentation

## 2012-07-01 DIAGNOSIS — N938 Other specified abnormal uterine and vaginal bleeding: Secondary | ICD-10-CM

## 2012-07-01 DIAGNOSIS — N840 Polyp of corpus uteri: Secondary | ICD-10-CM | POA: Insufficient documentation

## 2012-07-01 DIAGNOSIS — E039 Hypothyroidism, unspecified: Secondary | ICD-10-CM | POA: Insufficient documentation

## 2012-07-01 LAB — TSH: TSH: 0.803 u[IU]/mL (ref 0.350–4.500)

## 2012-07-01 LAB — WET PREP, GENITAL: Clue Cells Wet Prep HPF POC: NONE SEEN

## 2012-07-01 LAB — URINE MICROSCOPIC-ADD ON

## 2012-07-01 LAB — URINALYSIS, ROUTINE W REFLEX MICROSCOPIC
Glucose, UA: NEGATIVE mg/dL
Leukocytes, UA: NEGATIVE
Nitrite: NEGATIVE
Specific Gravity, Urine: 1.02 (ref 1.005–1.030)
pH: 7.5 (ref 5.0–8.0)

## 2012-07-01 LAB — CBC
Hemoglobin: 10.5 g/dL — ABNORMAL LOW (ref 12.0–15.0)
MCH: 26.1 pg (ref 26.0–34.0)
Platelets: 245 10*3/uL (ref 150–400)
RBC: 4.02 MIL/uL (ref 3.87–5.11)
WBC: 4.9 10*3/uL (ref 4.0–10.5)

## 2012-07-01 LAB — POCT PREGNANCY, URINE: Preg Test, Ur: NEGATIVE

## 2012-07-01 MED ORDER — MEDROXYPROGESTERONE ACETATE 400 MG/ML IM SUSP
400.0000 mg | Freq: Once | INTRAMUSCULAR | Status: AC
  Administered 2012-07-01: 400 mg via INTRAMUSCULAR
  Filled 2012-07-01: qty 1

## 2012-07-01 NOTE — MAU Note (Signed)
I've been on my menstrual for 4 months. Having a lot of stomach and back pain. Having some dizziness and very tired. Using a box of maxi pads every day.

## 2012-07-01 NOTE — MAU Provider Note (Signed)
Chart reviewed and agree with management and plan.  

## 2012-07-01 NOTE — Progress Notes (Signed)
Written and verbal d/c instructions given and understanding voiced. Waiting for provera inj and then will be d/c home.

## 2012-07-01 NOTE — MAU Provider Note (Signed)
History     CSN: [id number]  Arrival date and time: 07/01/12 1111   First Provider Initiated Contact with Patient 07/01/12 1200      Chief Complaint  Patient presents with  . Vaginal Bleeding   HPI  Pt is a 37 y/o G8P7 who presents today for abormal vaginal bleeding for approximately 6 months. She was seen here in November 2013 for the same problem, at which time she was given Depo Provera injection, prescribed progesterone tablets and ibuprofen and advised to follow-up with GYN clinic. She says that the bleeding stopped for a couple of days after the therapy but quickly resumed. She never followed up and has continued to bleed, and it has gotten heavier in the past few weeks. She says it is heavier than a normal period and she is going through 8-10 pads per day. She is staining her clothes and her sheets and sees blood in the toilet when she uses the bathroom. She says that she is often weak and feels lightheaded. She also endorses abdominal pain that began around the same time the bleeding started. It is crampy and intermittent. She has not experienced any dysuria or frequency. No vaginal symptoms such as itching or discomfort.  Past Medical History  Diagnosis Date  . Chlamydia   . No pertinent past medical history     Past Surgical History  Procedure Laterality Date  . Cesarean section    . Tubal ligation  2008    Family History  Problem Relation Age of Onset  . Hypertension Mother   . Diabetes Mother   . Hypertension Maternal Grandmother   . Heart disease Maternal Grandmother   . Hypertension Maternal Grandfather   . Heart disease Maternal Grandfather   . Other Neg Hx     History  Substance Use Topics  . Smoking status: Never Smoker   . Smokeless tobacco: Not on file  . Alcohol Use: Yes     Comment: occassional at family functions    Allergies: No Known Allergies  Prescriptions prior to admission  Medication Sig Dispense Refill  . naproxen sodium (ANAPROX) 220  MG tablet Take 680 mg by mouth 2 (two) times daily with a meal.        ROS Pertinent positives and negatives discussed in HPI Physical Exam   Blood pressure 130/92, pulse 88, temperature 98.1 F (36.7 C), resp. rate 20, height 5\' 4"  (1.626 m), weight 148.383 kg (327 lb 2 oz), SpO2 100.00%.  Physical Exam  Constitutional: She appears well-developed and well-nourished.  Morbidly obese  HENT:  Head: Normocephalic.  Neck: Normal range of motion.  Cardiovascular: Normal rate, regular rhythm and normal heart sounds.   Respiratory: Effort normal and breath sounds normal.  GI: Soft. She exhibits no distension. There is no tenderness.  Obese abdomen  Genitourinary: Vagina normal.  Moderate blood present in the vaginal vault Tenderness reported in R adnexa on bimanual exam  Skin: Skin is warm and dry.  No acanthosis nigricans seen on neck    MAU Course  Procedures  Assessment and Plan  DDx: uterine leiomyoma - common but ultrasound in 02/1012 normal, no need to repeat today. Endometrial polyp - previous ultrasound normal. Hypothyroidism - common cause of abnormal bleeding, TSH sent today.  Depo-Provera 400 mg IM for temporary relief of bleeding Follow-up with GYN clinic Recommend weight loss MDM: HGB stable today, not orthostatic. Discharge home.  Lorna Dibble, PA-S 07/01/2012, 12:24 PM   Results for orders placed during the hospital encounter  of 07/01/12 (from the past 24 hour(s))  URINALYSIS, ROUTINE W REFLEX MICROSCOPIC     Status: Abnormal   Collection Time    07/01/12 11:24 AM      Result Value Range   Color, Urine RED (*) YELLOW   APPearance CLOUDY (*) CLEAR   Specific Gravity, Urine 1.020  1.005 - 1.030   pH 7.5  5.0 - 8.0   Glucose, UA NEGATIVE  NEGATIVE mg/dL   Hgb urine dipstick LARGE (*) NEGATIVE   Bilirubin Urine NEGATIVE  NEGATIVE   Ketones, ur NEGATIVE  NEGATIVE mg/dL   Protein, ur 098 (*) NEGATIVE mg/dL   Urobilinogen, UA 0.2  0.0 - 1.0 mg/dL   Nitrite  NEGATIVE  NEGATIVE   Leukocytes, UA NEGATIVE  NEGATIVE  URINE MICROSCOPIC-ADD ON     Status: Abnormal   Collection Time    07/01/12 11:24 AM      Result Value Range   Squamous Epithelial / LPF FEW (*) RARE   RBC / HPF TOO NUMEROUS TO COUNT  <3 RBC/hpf  CBC     Status: Abnormal   Collection Time    07/01/12 11:41 AM      Result Value Range   WBC 4.9  4.0 - 10.5 K/uL   RBC 4.02  3.87 - 5.11 MIL/uL   Hemoglobin 10.5 (*) 12.0 - 15.0 g/dL   HCT 11.9 (*) 14.7 - 82.9 %   MCV 81.1  78.0 - 100.0 fL   MCH 26.1  26.0 - 34.0 pg   MCHC 32.2  30.0 - 36.0 g/dL   RDW 56.2  13.0 - 86.5 %   Platelets 245  150 - 400 K/uL  POCT PREGNANCY, URINE     Status: None   Collection Time    07/01/12 12:25 PM      Result Value Range   Preg Test, Ur NEGATIVE  NEGATIVE  WET PREP, GENITAL     Status: None   Collection Time    07/01/12 12:30 PM      Result Value Range   Yeast Wet Prep HPF POC NONE SEEN  NONE SEEN   Trich, Wet Prep NONE SEEN  NONE SEEN   Clue Cells Wet Prep HPF POC NONE SEEN  NONE SEEN   WBC, Wet Prep HPF POC NONE SEEN  NONE SEEN    MDM 37 y.o. morbidly obese A/A female with vaginal bleeding. She is not orthostatic, Hgb today is 10.5, bleeding is moderate. I have examined the patient and she is stable to receive the Depo Provera today and follow up in the GYN Clinic in 2 weeks for possible endometrial biopsy. She will return here if symptoms worsen.   Assessment: Dysfunctional Uterine Bleeding  Plan:  Depo Provera 400 mg IM   Continue ibuprofen   GYN Clinic 2 weeks   I have discussed this patient with Dr. Shawnie Pons. I have reviewed this patient's vital signs, nurses notes, appropriate labs and imaging.  Findings and plan of care discussed with the patient in detail. Patient voices understanding.

## 2012-07-02 LAB — GC/CHLAMYDIA PROBE AMP: GC Probe RNA: NEGATIVE

## 2012-08-06 ENCOUNTER — Encounter: Payer: Self-pay | Admitting: Obstetrics and Gynecology

## 2012-10-02 ENCOUNTER — Encounter (HOSPITAL_COMMUNITY): Payer: Self-pay | Admitting: *Deleted

## 2012-10-02 ENCOUNTER — Emergency Department (INDEPENDENT_AMBULATORY_CARE_PROVIDER_SITE_OTHER)
Admission: EM | Admit: 2012-10-02 | Discharge: 2012-10-02 | Disposition: A | Discharge status: Home / Self Care | Payer: Self-pay | Source: Home / Self Care | Attending: Emergency Medicine | Admitting: Emergency Medicine

## 2012-10-02 DIAGNOSIS — H9209 Otalgia, unspecified ear: Secondary | ICD-10-CM

## 2012-10-02 DIAGNOSIS — H9202 Otalgia, left ear: Secondary | ICD-10-CM

## 2012-10-02 DIAGNOSIS — M26609 Unspecified temporomandibular joint disorder, unspecified side: Secondary | ICD-10-CM

## 2012-10-02 LAB — POCT PREGNANCY, URINE: Preg Test, Ur: NEGATIVE

## 2012-10-02 MED ORDER — CYCLOBENZAPRINE HCL 10 MG PO TABS: 10.0000 mg | ORAL_TABLET | Freq: Two times a day (BID) | ORAL | Status: DC | PRN

## 2012-10-02 MED ORDER — MELOXICAM 7.5 MG PO TABS: 7.5000 mg | ORAL_TABLET | Freq: Every day | ORAL | Status: AC

## 2012-10-02 NOTE — ED Provider Notes (Addendum)
History    CSN: [id number] Arrival date & time 10/02/12  1136  First MD Initiated Contact with Patient 10/02/12 1234     Chief Complaint  Patient presents with  . Otalgia   (Consider location/radiation/quality/duration/timing/severity/associated sxs/prior Treatment) HPI Comments: Patient presents urgent care complaining of right ear pain for several days she also describes that have been feeling dizzy at times it is uncertain if related to more concern. Denies any chest pains, palpitations or shortness of breath. Denies any recent trauma or injury to her ear, patient denies any recent congestion or allergies. Denies any sore throat. Does describe tenderness in the pre-regular area on her left and right side as well. Pain exacerbates when she opens her mouth.  Patient is a 37 y.o. female presenting with ear pain. The history is provided by the patient.  Otalgia Location:  Right Behind ear:  No abnormality Quality:  Aching Severity:  Mild Onset quality:  Gradual Duration:  8 days Timing:  Constant Progression:  Worsening Context: not direct blow, not foreign body in ear and no water in ear   Relieved by:  Nothing Associated symptoms: no congestion, no cough, no fever, no headaches, no hearing loss, no neck pain, no rhinorrhea, no tinnitus and no vomiting   Risk factors: no recent travel, no chronic ear infection and no prior ear surgery    Past Medical History  Diagnosis Date  . Chlamydia   . No pertinent past medical history    Past Surgical History  Procedure Laterality Date  . Cesarean section    . Tubal ligation  2008   Family History  Problem Relation Age of Onset  . Hypertension Mother   . Diabetes Mother   . Hypertension Maternal Grandmother   . Heart disease Maternal Grandmother   . Hypertension Maternal Grandfather   . Heart disease Maternal Grandfather   . Other Neg Hx    History  Substance Use Topics  . Smoking status: Never Smoker   . Smokeless tobacco:  Not on file  . Alcohol Use: Yes     Comment: occassional at family functions   OB History   Grav Para Term Preterm Abortions TAB SAB Ect Mult Living   8 7 3 4 1  1   7      Review of Systems  Constitutional: Negative for fever and activity change.  HENT: Positive for ear pain. Negative for hearing loss, congestion, facial swelling, rhinorrhea, neck pain, neck stiffness and tinnitus.   Eyes: Negative for pain and visual disturbance.  Respiratory: Negative for cough and shortness of breath.   Gastrointestinal: Negative for vomiting.  Neurological: Negative for headaches.    Allergies  Review of patient's allergies indicates no known allergies.  Home Medications   Current Outpatient Rx  Name  Route  Sig  Dispense  Refill  . cyclobenzaprine (FLEXERIL) 10 MG tablet   Oral   Take 1 tablet (10 mg total) by mouth 2 (two) times daily as needed for muscle spasms.   20 tablet   0   . meloxicam (MOBIC) 7.5 MG tablet   Oral   Take 1 tablet (7.5 mg total) by mouth daily. Take one tablet daily for 2 weeks   14 tablet   0    BP 110/77  Pulse 77  Temp(Src) 97.8 F (36.6 C) (Oral)  Resp 18  SpO2 100%  LMP 08/09/2012 Physical Exam  Vitals reviewed. Constitutional: Vital signs are normal. She appears well-developed and well-nourished.  Non-toxic appearance. She  does not have a sickly appearance. She does not appear ill.  HENT:  Head: Normocephalic.    Right Ear: Hearing, tympanic membrane, external ear and ear canal normal.  Left Ear: Hearing, tympanic membrane, external ear and ear canal normal.  Mouth/Throat: No oropharyngeal exudate.  Eyes: Conjunctivae are normal. Pupils are equal, round, and reactive to light.  Neck: Neck supple. No JVD present.  Pulmonary/Chest: Effort normal and breath sounds normal. She has no decreased breath sounds.  Neurological: She is alert.  Skin: No rash noted. No erythema.    ED Course  Procedures (including critical care time) Labs Reviewed   POCT PREGNANCY, URINE   No results found. 1. Temporomandibular disorder   2. 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 Have prescribed a cause to inhibitor cycle for 10-14 days along with muscle relaxer at night. Have discussed with patient followup with ENT provider or primary care Dr. if no improvement is noted after 10-14 days. Patient understands and agrees with treatment plan followup care as necessary.  Jimmie Molly, MD 10/02/12 1348  Jimmie Molly, MD 10/02/12 [phone]

## 2012-10-02 NOTE — ED Notes (Signed)
Pt  Reports  Symptoms  Of  Headache       lightheaded  At  Times          Reports       r  Earache          She  Ambulated       To   Room  With   A  Steady  Fluid  Gait

## 2014-02-08 ENCOUNTER — Encounter (HOSPITAL_COMMUNITY): Payer: Self-pay | Admitting: *Deleted

## 2014-09-12 ENCOUNTER — Encounter (HOSPITAL_COMMUNITY): Payer: Self-pay | Admitting: Emergency Medicine

## 2014-09-12 ENCOUNTER — Emergency Department (INDEPENDENT_AMBULATORY_CARE_PROVIDER_SITE_OTHER): Payer: Self-pay

## 2014-09-12 ENCOUNTER — Emergency Department (INDEPENDENT_AMBULATORY_CARE_PROVIDER_SITE_OTHER)
Admission: EM | Admit: 2014-09-12 | Discharge: 2014-09-12 | Disposition: A | Discharge status: Home / Self Care | Payer: Self-pay | Source: Home / Self Care | Attending: Family Medicine | Admitting: Family Medicine

## 2014-09-12 DIAGNOSIS — M25561 Pain in right knee: Secondary | ICD-10-CM

## 2014-09-12 DIAGNOSIS — M25559 Pain in unspecified hip: Secondary | ICD-10-CM

## 2014-09-12 MED ORDER — NAPROXEN 500 MG PO TABS: 500.0000 mg | ORAL_TABLET | Freq: Two times a day (BID) | ORAL | Status: AC
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
## 2014-09-12 NOTE — Discharge Instructions (Signed)
Thank you for coming in today. Try losing weight.  Follow up with Dr. Alfonso Ramus.  Take naproxen for pain as needed.   Osteoarthritis Osteoarthritis is a disease that causes soreness and inflammation of a joint. It occurs when the cartilage at the affected joint wears down. Cartilage acts as a cushion, covering the ends of bones where they meet to form a joint. Osteoarthritis is the most common form of arthritis. It often occurs in older people. The joints affected most often by this condition include those in the:  Ends of the fingers.  Thumbs.  Neck.  Lower back.  Knees.  Hips. CAUSES  Over time, the cartilage that covers the ends of bones begins to wear away. This causes bone to rub on bone, producing pain and stiffness in the affected joints.  RISK FACTORS Certain factors can increase your chances of having osteoarthritis, including:  Older age.  Excessive body weight.  Overuse of joints.  Previous joint injury. SIGNS AND SYMPTOMS   Pain, swelling, and stiffness in the joint.  Over time, the joint may lose its normal shape.  Small deposits of bone (osteophytes) may grow on the edges of the joint.  Bits of bone or cartilage can break off and float inside the joint space. This may cause more pain and damage. DIAGNOSIS  Your health care provider will do a physical exam and ask about your symptoms. Various tests may be ordered, such as:  X-rays of the affected joint.  An MRI scan.  Blood tests to rule out other types of arthritis.  Joint fluid tests. This involves using a needle to draw fluid from the joint and examining the fluid under a microscope. TREATMENT  Goals of treatment are to control pain and improve joint function. Treatment plans may include:  A prescribed exercise program that allows for rest and joint relief.  A weight control plan.  Pain relief techniques, such as:  Properly applied heat and cold.  Electric pulses delivered to nerve endings  under the skin (transcutaneous electrical nerve stimulation [TENS]).  Massage.  Certain nutritional supplements.  Medicines to control pain, such as:  Acetaminophen.  Nonsteroidal anti-inflammatory drugs (NSAIDs), such as naproxen.  Narcotic or central-acting agents, such as tramadol.  Corticosteroids. These can be given orally or as an injection.  Surgery to reposition the bones and relieve pain (osteotomy) or to remove loose pieces of bone and cartilage. Joint replacement may be needed in advanced states of osteoarthritis. HOME CARE INSTRUCTIONS   Take medicines only as directed by your health care provider.  Maintain a healthy weight. Follow your health care provider's instructions for weight control. This may include dietary instructions.  Exercise as directed. Your health care provider can recommend specific types of exercise. These may include:  Strengthening exercises. These are done to strengthen the muscles that support joints affected by arthritis. They can be performed with weights or with exercise bands to add resistance.  Aerobic activities. These are exercises, such as brisk walking or low-impact aerobics, that get your heart pumping.  Range-of-motion activities. These keep your joints limber.  Balance and agility exercises. These help you maintain daily living skills.  Rest your affected joints as directed by your health care provider.  Keep all follow-up visits as directed by your health care provider. SEEK MEDICAL CARE IF:   Your skin turns red.  You develop a rash in addition to your joint pain.  You have worsening joint pain.  You have a fever along with joint or  muscle aches. SEEK IMMEDIATE MEDICAL CARE IF:  You have a significant loss of weight or appetite.  You have night sweats. [place] of Arthritis and Musculoskeletal and Skin Diseases: www.niams.SouthExposed.es  [employer] on Aging:  http://kim-miller.com/  American College of Rheumatology: www.rheumatology.org Document Released: 03/26/2005 Document Revised: 08/10/2013 Document Reviewed: 12/01/2012 [facility] Patient Information 2015 [place], [place]. This information is not intended to replace advice given to you by your health care provider. Make sure you discuss any questions you have with your health care provider.
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
## 2015-05-09 ENCOUNTER — Emergency Department (HOSPITAL_COMMUNITY)
Admission: EM | Admit: 2015-05-09 | Discharge: 2015-05-09 | Disposition: A | Discharge status: Home / Self Care | Payer: Self-pay | Attending: Emergency Medicine | Admitting: Emergency Medicine

## 2015-05-09 ENCOUNTER — Encounter (HOSPITAL_COMMUNITY): Payer: Self-pay | Admitting: Emergency Medicine

## 2015-05-09 DIAGNOSIS — R1030 Lower abdominal pain, unspecified: Secondary | ICD-10-CM

## 2015-05-09 DIAGNOSIS — B3731 Acute candidiasis of vulva and vagina: Secondary | ICD-10-CM

## 2015-05-09 DIAGNOSIS — B9689 Other specified bacterial agents as the cause of diseases classified elsewhere: Secondary | ICD-10-CM

## 2015-05-09 DIAGNOSIS — E669 Obesity, unspecified: Secondary | ICD-10-CM | POA: Insufficient documentation

## 2015-05-09 DIAGNOSIS — Z3202 Encounter for pregnancy test, result negative: Secondary | ICD-10-CM | POA: Insufficient documentation

## 2015-05-09 DIAGNOSIS — B373 Candidiasis of vulva and vagina: Secondary | ICD-10-CM | POA: Insufficient documentation

## 2015-05-09 DIAGNOSIS — N898 Other specified noninflammatory disorders of vagina: Secondary | ICD-10-CM

## 2015-05-09 DIAGNOSIS — I10 Essential (primary) hypertension: Secondary | ICD-10-CM | POA: Insufficient documentation

## 2015-05-09 DIAGNOSIS — N760 Acute vaginitis: Secondary | ICD-10-CM | POA: Insufficient documentation

## 2015-05-09 DIAGNOSIS — N390 Urinary tract infection, site not specified: Secondary | ICD-10-CM | POA: Insufficient documentation

## 2015-05-09 DIAGNOSIS — E876 Hypokalemia: Secondary | ICD-10-CM | POA: Insufficient documentation

## 2015-05-09 DIAGNOSIS — Z7251 High risk heterosexual behavior: Secondary | ICD-10-CM | POA: Insufficient documentation

## 2015-05-09 LAB — URINE MICROSCOPIC-ADD ON

## 2015-05-09 LAB — COMPREHENSIVE METABOLIC PANEL WITH GFR
ALT: 27 U/L (ref 14–54)
AST: 26 U/L (ref 15–41)
Albumin: 3.5 g/dL (ref 3.5–5.0)
Alkaline Phosphatase: 42 U/L (ref 38–126)
Anion gap: 10 (ref 5–15)
BUN: 8 mg/dL (ref 6–20)
CO2: 25 mmol/L (ref 22–32)
Calcium: 8.7 mg/dL — ABNORMAL LOW (ref 8.9–10.3)
Chloride: 104 mmol/L (ref 101–111)
Creatinine, Ser: 0.59 mg/dL (ref 0.44–1.00)
GFR calc Af Amer: >60 mL/min
GFR calc non Af Amer: >60 mL/min
Glucose, Bld: 93 mg/dL (ref 65–99)
Potassium: 3.3 mmol/L — ABNORMAL LOW (ref 3.5–5.1)
Sodium: 139 mmol/L (ref 135–145)
Total Bilirubin: 0.2 mg/dL — ABNORMAL LOW (ref 0.3–1.2)
Total Protein: 6.9 g/dL (ref 6.5–8.1)

## 2015-05-09 LAB — URINALYSIS, ROUTINE W REFLEX MICROSCOPIC
Bilirubin Urine: NEGATIVE
GLUCOSE, UA: NEGATIVE mg/dL
Ketones, ur: NEGATIVE mg/dL
Nitrite: NEGATIVE
PH: 6 (ref 5.0–8.0)
Protein, ur: 30 mg/dL — AB
Specific Gravity, Urine: 1.009 (ref 1.005–1.030)

## 2015-05-09 LAB — CBC
HEMATOCRIT: 38.4 % (ref 36.0–46.0)
Hemoglobin: 12.4 g/dL (ref 12.0–15.0)
MCH: 27.1 pg (ref 26.0–34.0)
MCHC: 32.3 g/dL (ref 30.0–36.0)
MCV: 84 fL (ref 78.0–100.0)
Platelets: 251 10*3/uL (ref 150–400)
RBC: 4.57 MIL/uL (ref 3.87–5.11)
RDW: 13.6 % (ref 11.5–15.5)
WBC: 6.7 10*3/uL (ref 4.0–10.5)

## 2015-05-09 LAB — WET PREP, GENITAL
Sperm: NONE SEEN
Trich, Wet Prep: NONE SEEN

## 2015-05-09 LAB — LIPASE, BLOOD: Lipase: 24 U/L (ref 11–51)

## 2015-05-09 LAB — PREGNANCY, URINE: PREG TEST UR: NEGATIVE

## 2015-05-09 MED ORDER — CEFTRIAXONE SODIUM 250 MG IJ SOLR
250.0000 mg | Freq: Once | INTRAMUSCULAR | Status: AC
  Administered 2015-05-09: 250 mg via INTRAMUSCULAR
  Filled 2015-05-09: qty 250

## 2015-05-09 MED ORDER — METRONIDAZOLE 500 MG PO TABS: 500.0000 mg | ORAL_TABLET | Freq: Two times a day (BID) | ORAL | Status: DC

## 2015-05-09 MED ORDER — FLUCONAZOLE 150 MG PO TABS
150.0000 mg | ORAL_TABLET | Freq: Once | ORAL | Status: AC
  Administered 2015-05-09: 150 mg via ORAL
  Filled 2015-05-09: qty 1

## 2015-05-09 MED ORDER — AZITHROMYCIN 250 MG PO TABS
1000.0000 mg | ORAL_TABLET | Freq: Once | ORAL | Status: AC
  Administered 2015-05-09: 1000 mg via ORAL
  Filled 2015-05-09: qty 4

## 2015-05-09 MED ORDER — LIDOCAINE HCL (PF) 1 % IJ SOLN
INTRAMUSCULAR | Status: AC
  Administered 2015-05-09: 0.9 mL
  Filled 2015-05-09: qty 5

## 2015-05-09 MED ORDER — CEPHALEXIN 500 MG PO CAPS: ORAL_CAPSULE | ORAL | Status: DC

## 2015-05-09 NOTE — ED Notes (Signed)
Patient c/o lower abdominal pain, white vaginal discharge, and urinary frequency x2 days. Denies N/V/D, denies fever/chills.

## 2015-05-09 NOTE — Discharge Instructions (Signed)
You have been treated for gonorrhea and chlamydia in the ER but the hospital will call you if lab is positive. You were tested for HIV and Syphilis, and the hospital will call you if the lab is positive. Do NOT engage in sexual activity until you hear back about your results. Always use protection/condoms. You were treated for yeast which was likely causing the vaginal discharge and itching. You do not need any further treatment for this since it's just a one-time medication for yeast. Change back to your old soap to help with symptoms. Your swabs also showed bacterial vaginosis, take flagyl as directed but AVOID ALCOHOL WHILE TAKING THIS MEDICATION.  You also have a urinary tract infection. Stay very well hydrated with plenty of water throughout the day. Take antibiotic Keflex until completed. Use tylenol or motrin as needed for pain.  Your labs revealed that your potassium was slightly low, eat higher-potassium foods using the list below for reference. You also had a high blood pressure today, keep track of your blood pressure at home and keep a log to take to your follow up doctor's visit, eat a low-salt diet to help with your blood pressure.   Follow up with primary care physician and OBGYN in 1 week for recheck of ongoing symptoms but return to ER for emergent changing or worsening of symptoms. Please seek immediate care if you develop the following: You develop back pain.  Your symptoms are no better, or worse in 3 days. There is severe back pain or lower abdominal pain.  You develop chills.  You have a fever.  There is nausea or vomiting.  There is continued burning or discomfort with urination.     Urinary Tract Infection A urinary tract infection (UTI) can occur any place along the urinary tract. The tract includes the kidneys, ureters, bladder, and urethra. A type of germ called bacteria often causes a UTI. UTIs are often helped with antibiotic medicine.  HOME CARE   If given, take  antibiotics as told by your doctor. Finish them even if you start to feel better.  Drink enough fluids to keep your pee (urine) clear or pale yellow.  Avoid tea, drinks with caffeine, and bubbly (carbonated) drinks.  Pee often. Avoid holding your pee in for a long time.  Pee before and after having sex (intercourse).  Wipe from front to back after you poop (bowel movement) if you are a woman. Use each tissue only once. GET HELP RIGHT AWAY IF:   You have back pain.  You have lower belly (abdominal) pain.  You have chills.  You feel sick to your stomach (nauseous).  You throw up (vomit).  Your burning or discomfort with peeing does not go away.  You have a fever.  Your symptoms are not better in 3 days. MAKE SURE YOU:   Understand these instructions.  Will watch your condition.  Will get help right away if you are not doing well or get worse.   This information is not intended to replace advice given to you by your health care provider. Make sure you discuss any questions you have with your health care provider.   Document Released: 09/12/2007 Document Revised: 04/16/2014 Document Reviewed: 10/25/2011 Elsevier Interactive Patient Education 2016 [employer]  Monilial Vaginitis Vaginitis in a soreness, swelling and redness (inflammation) of the vagina and vulva. Monilial vaginitis is not a sexually transmitted infection. CAUSES  Yeast vaginitis is caused by yeast (candida) that is normally found in your vagina. With  a yeast infection, the candida has overgrown in number to a point that upsets the chemical balance. SYMPTOMS   White, thick vaginal discharge.  Swelling, itching, redness and irritation of the vagina and possibly the lips of the vagina (vulva).  Burning or painful urination.  Painful intercourse. DIAGNOSIS  Things that may contribute to monilial vaginitis are:  Postmenopausal and virginal states.  Pregnancy.  Infections.  Being tired, sick or  stressed, especially if you had monilial vaginitis in the past.  Diabetes. Good control will help lower the chance.  Birth control pills.  Tight fitting garments.  Using bubble bath, feminine sprays, douches or deodorant tampons.  Taking certain medications that kill germs (antibiotics).  Sporadic recurrence can occur if you become ill. TREATMENT  Your caregiver will give you medication.  There are several kinds of anti monilial vaginal creams and suppositories specific for monilial vaginitis. For recurrent yeast infections, use a suppository or cream in the vagina 2 times a week, or as directed.  Anti-monilial or steroid cream for the itching or irritation of the vulva may also be used. Get your caregiver's permission.  Painting the vagina with methylene blue solution may help if the monilial cream does not work.  Eating yogurt may help prevent monilial vaginitis. HOME CARE INSTRUCTIONS   Finish all medication as prescribed.  Do not have sex until treatment is completed or after your caregiver tells you it is okay.  Take warm sitz baths.  Do not douche.  Do not use tampons, especially scented ones.  Wear cotton underwear.  Avoid tight pants and panty hose.  Tell your sexual partner that you have a yeast infection. They should go to their caregiver if they have symptoms such as mild rash or itching.  Your sexual partner should be treated as well if your infection is difficult to eliminate.  Practice safer sex. Use condoms.  Some vaginal medications cause latex condoms to fail. Vaginal medications that harm condoms are:  Cleocin cream.  Butoconazole (Femstat).  Terconazole (Terazol) vaginal suppository.  Miconazole (Monistat) (may be purchased over the counter). SEEK MEDICAL CARE IF:   You have a temperature by mouth above 102 F (38.9 C).  The infection is getting worse after 2 days of treatment.  The infection is not getting better after 3 days of  treatment.  You develop blisters in or around your vagina.  You develop vaginal bleeding, and it is not your menstrual period.  You have pain when you urinate.  You develop intestinal problems.  You have pain with sexual intercourse.   This information is not intended to replace advice given to you by your health care provider. Make sure you discuss any questions you have with your health care provider.   Document Released: 01/03/2005 Document Revised: 06/18/2011 Document Reviewed: 09/27/2014 Elsevier Interactive Patient Education 2016 [employer].  Vaginitis Vaginitis is an inflammation of the vagina. It can happen when the normal bacteria and yeast in the vagina grow too much. There are different types. Treatment will depend on the type you have. HOME CARE  Take all medicines as told by your doctor.  Keep your vagina area clean and dry. Avoid soap. Rinse the area with water.  Avoid washing and cleaning out the vagina (douching).  Do not use tampons or have sex (intercourse) until your treatment is done.  Wipe from front to back after going to the restroom.  Wear cotton underwear.  Avoid wearing underwear while you sleep until your vaginitis is gone.  Avoid tight pants. Avoid underwear or nylons without a cotton panel.  Take off wet clothing (such as a bathing suit) as soon as you can.  Use mild, unscented products. Avoid fabric softeners and scented:  Feminine sprays.  Laundry detergents.  Tampons.  Soaps or bubble baths.  Practice safe sex and use condoms. GET HELP RIGHT AWAY IF:   You have belly (abdominal) pain.  You have a fever or lasting symptoms for more than 2-3 days.  You have a fever and your symptoms suddenly get worse. MAKE SURE YOU:   Understand these instructions.  Will watch this condition.  Will get help right away if you are not doing well or get worse.   This information is not intended to replace advice given to you by your  health care provider. Make sure you discuss any questions you have with your health care provider.   Document Released: 06/22/2008 Document Revised: 12/19/2011 Document Reviewed: 09/06/2011 Elsevier Interactive Patient Education 2016 Else[employer] Inc.  Bacterial Vaginosis Bacterial vaginosis is an infection of the vagina. It happens when too many germs (bacteria) grow in the vagina. Having this infection puts you at risk for getting other infections from sex. Treating this infection can help lower your risk for other infections, such as:   Chlamydia.  Gonorrhea.  HIV.  Herpes. HOME CARE  Take your medicine as told by your doctor.  Finish your medicine even if you start to feel better.  Tell your sex partner that you have an infection. They should see their doctor for treatment.  During treatment:  Avoid sex or use condoms correctly.  Do not douche.  Do not drink alcohol unless your doctor tells you it is ok.  Do not breastfeed unless your doctor tells you it is ok. GET HELP IF:  You are not getting better after 3 days of treatment.  You have more grey fluid (discharge) coming from your vagina than before.  You have more pain than before.  You have a fever. MAKE SURE YOU:   Understand these instructions.  Will watch your condition.  Will get help right away if you are not doing well or get worse.   This information is not intended to replace advice given to you by your health care provider. Make sure you discuss any questions you have with your health care provider.   Document Released: 01/03/2008 Document Revised: 04/16/2014 Document Reviewed: 11/05/2012 Elsevier Interactive Patient Education 2016 [place] Sex Safe sex is about reducing the risk of giving or getting a sexually transmitted disease (STD). STDs are spread through sexual contact involving the genitals, mouth, or rectum. Some STDs can be cured and others cannot. Safe sex can also prevent  unintended pregnancies.  WHAT ARE SOME SAFE SEX PRACTICES?  Limit your sexual activity to only one partner who is having sex with only you.  Talk to your partner about his or her past partners, past STDs, and drug use.  Use a condom every time you have sexual intercourse. This includes vaginal, oral, and anal sexual activity. Both females and males should wear condoms during oral sex. Only use latex or polyurethane condoms and water-based lubricants. Using petroleum-based lubricants or oils to lubricate a condom will weaken the condom and increase the chance that it will break. The condom should be in place from the beginning to the end of sexual activity. Wearing a condom reduces, but does not completely eliminate, your risk of getting or giving an STD. STDs can be  spread by contact with infected body fluids and skin.  Get vaccinated for hepatitis B and HPV.  Avoid alcohol and recreational drugs, which can affect your judgment. You may forget to use a condom or participate in high-risk sex.  For females, avoid douching after sexual intercourse. Douching can spread an infection farther into the reproductive tract.  Check your body for signs of sores, blisters, rashes, or unusual discharge. See your health care provider if you notice any of these signs.  Avoid sexual contact if you have symptoms of an infection or are being treated for an STD. If you or your partner has herpes, avoid sexual contact when blisters are present. Use condoms at all other times.  If you are at risk of being infected with HIV, it is recommended that you take a prescription medicine daily to prevent HIV infection. This is called pre-exposure prophylaxis (PrEP). You are considered at risk if:  You are a man who has sex with other men (MSM).  You are a heterosexual man or woman who is sexually active with more than one partner.  You take drugs by injection.  You are sexually active with a partner who has HIV.  Talk  with your health care provider about whether you are at high risk of being infected with HIV. If you choose to begin PrEP, you should first be tested for HIV. You should then be tested every 3 months for as long as you are taking PrEP.  See your health care provider for regular screenings, exams, and tests for other STDs. Before having sex with a new partner, each of you should be screened for STDs and should talk about the results with each other. WHAT ARE THE BENEFITS OF SAFE SEX?   There is less chance of getting or giving an STD.  You can prevent unwanted or unintended pregnancies.  By discussing safe sex concerns with your partner, you may increase feelings of intimacy, comfort, trust, and honesty between the two of you.   This information is not intended to replace advice given to you by your health care provider. Make sure you discuss any questions you have with your health care provider.   Document Released: 05/03/2004 Document Revised: 04/16/2014 Document Reviewed: 09/17/2011 Elsevier Interactive Patient Education 2016 [place].  Potassium Content of Foods Potassium is a mineral found in many foods and drinks. It helps keep fluids and minerals balanced in your body and affects how steadily your heart beats. Potassium also helps control your blood pressure and keep your muscles and nervous system healthy. Certain health conditions and medicines may change the balance of potassium in your body. When this happens, you can help balance your level of potassium through the foods that you do or do not eat. Your health care provider or dietitian may recommend an amount of potassium that you should have each day. The following lists of foods provide the amount of potassium (in parentheses) per serving in each item. HIGH IN POTASSIUM  The following foods and beverages have 200 mg or more of potassium per serving:  Apricots, 2 raw or 5 dry (200 mg).  Artichoke, 1 medium (345  mg).  Avocado, raw,  each (245 mg).  Banana, 1 medium (425 mg).  Beans, lima, or baked beans, canned,  cup (280 mg).  Beans, white, canned,  cup (595 mg).  Beef roast, 3 oz (320 mg).  Beef, ground, 3 oz (270 mg).  Beets, raw or cooked,  cup (260 mg).  Bran  muffin, 2 oz (300 mg).  Broccoli,  cup (230 mg).  Brussels sprouts,  cup (250 mg).  Cantaloupe,  cup (215 mg).  Cereal, 100% bran,  cup (200-400 mg).  Cheeseburger, single, fast food, 1 each (225-400 mg).  Chicken, 3 oz (220 mg).  Clams, canned, 3 oz (535 mg).  Crab, 3 oz (225 mg).  Dates, 5 each (270 mg).  Dried beans and peas,  cup (300-475 mg).  Figs, dried, 2 each (260 mg).  Fish: halibut, tuna, cod, snapper, 3 oz (480 mg).  Fish: salmon, haddock, swordfish, perch, 3 oz (300 mg).  Fish, tuna, canned 3 oz (200 mg).  [place] fries, fast food, 3 oz (470 mg).  Granola with fruit and nuts,  cup (200 mg).  Grapefruit juice,  cup (200 mg).  Greens, beet,  cup (655 mg).  Honeydew melon,  cup (200 mg).  Kale, raw, 1 cup (300 mg).  Kiwi, 1 medium (240 mg).  Kohlrabi, rutabaga, parsnips,  cup (280 mg).  Lentils,  cup (365 mg).  Mango, 1 each (325 mg).  Milk, chocolate, 1 cup (420 mg).  Milk: nonfat, low-fat, whole, buttermilk, 1 cup (350-380 mg).  Molasses, 1 Tbsp (295 mg).  Mushrooms,  cup (280) mg.  Nectarine, 1 each (275 mg).  Nuts: almonds, peanuts, hazelnuts, [place], cashew, mixed, 1 oz (200 mg).  Nuts, pistachios, 1 oz (295 mg).  Orange, 1 each (240 mg).  Orange juice,  cup (235 mg).  Papaya, medium,  fruit (390 mg).  Peanut butter, chunky, 2 Tbsp (240 mg).  Peanut butter, smooth, 2 Tbsp (210 mg).  Pear, 1 medium (200 mg).  Pomegranate, 1 whole (400 mg).  Pomegranate juice,  cup (215 mg).  Pork, 3 oz (350 mg).  Potato chips, salted, 1 oz (465 mg).  Potato, baked with skin, 1 medium (925 mg).  Potatoes, boiled,  cup (255 mg).  Potatoes, mashed,   cup (330 mg).  Prune juice,  cup (370 mg).  Prunes, 5 each (305 mg).  Pudding, chocolate,  cup (230 mg).  Pumpkin, canned,  cup (250 mg).  Raisins, seedless,  cup (270 mg).  Seeds, sunflower or pumpkin, 1 oz (240 mg).  Soy milk, 1 cup (300 mg).  Spinach,  cup (420 mg).  Spinach, canned,  cup (370 mg).  Sweet potato, baked with skin, 1 medium (450 mg).  Swiss chard,  cup (480 mg).  Tomato or vegetable juice,  cup (275 mg).  Tomato sauce or puree,  cup (400-550 mg).  Tomato, raw, 1 medium (290 mg).  Tomatoes, canned,  cup (200-300 mg).  [place], 3 oz (250 mg).  Wheat germ, 1 oz (250 mg).  Winter squash,  cup (250 mg).  Yogurt, plain or fruited, 6 oz (260-435 mg).  Zucchini,  cup (220 mg). MODERATE IN POTASSIUM The following foods and beverages have 50-200 mg of potassium per serving:  Apple, 1 each (150 mg).  Apple juice,  cup (150 mg).  Applesauce,  cup (90 mg).  Apricot nectar,  cup (140 mg).  Asparagus, small spears,  cup or 6 spears (155 mg).  Bagel, cinnamon raisin, 1 each (130 mg).  Bagel, egg or plain, 4 in., 1 each (70 mg).  Beans, green,  cup (90 mg).  Beans, yellow,  cup (190 mg).  Beer, regular, 12 oz (100 mg).  Beets, canned,  cup (125 mg).  Blackberries,  cup (115 mg).  Blueberries,  cup (60 mg).  Bread, whole wheat, 1 slice (70 mg).  Broccoli, raw,  cup (145 mg).  Cabbage,  cup (150 mg).  Carrots, cooked or raw,  cup (180 mg).  Cauliflower, raw,  cup (150 mg).  Celery, raw,  cup (155 mg).  Cereal, bran flakes, cup (120-150 mg).  Cheese, cottage,  cup (110 mg).  Cherries, 10 each (150 mg).  Chocolate, 1 oz bar (165 mg).  Coffee, brewed 6 oz (90 mg).  Corn,  cup or 1 ear (195 mg).  Cucumbers,  cup (80 mg).  Egg, large, 1 each (60 mg).  Eggplant,  cup (60 mg).  Endive, raw, cup (80 mg).  English muffin, 1 each (65 mg).  Fish, orange roughy, 3 oz (150 mg).  Frankfurter,  beef or pork, 1 each (75 mg).  Fruit cocktail,  cup (115 mg).  Grape juice,  cup (170 mg).  Grapefruit,  fruit (175 mg).  Grapes,  cup (155 mg).  Greens: kale, turnip, collard,  cup (110-150 mg).  Ice cream or frozen yogurt, chocolate,  cup (175 mg).  Ice cream or frozen yogurt, vanilla,  cup (120-150 mg).  Lemons, limes, 1 each (80 mg).  Lettuce, all types, 1 cup (100 mg).  Mixed vegetables,  cup (150 mg).  Mushrooms, raw,  cup (110 mg).  Nuts: walnuts, pecans, or macadamia, 1 oz (125 mg).  Oatmeal,  cup (80 mg).  Okra,  cup (110 mg).  Onions, raw,  cup (120 mg).  Peach, 1 each (185 mg).  Peaches, canned,  cup (120 mg).  Pears, canned,  cup (120 mg).  Peas, green, frozen,  cup (90 mg).  Peppers, green,  cup (130 mg).  Peppers, red,  cup (160 mg).  Pineapple juice,  cup (165 mg).  Pineapple, fresh or canned,  cup (100 mg).  Plums, 1 each (105 mg).  Pudding, vanilla,  cup (150 mg).  Raspberries,  cup (90 mg).  Rhubarb,  cup (115 mg).  Rice, wild,  cup (80 mg).  Shrimp, 3 oz (155 mg).  Spinach, raw, 1 cup (170 mg).  Strawberries,  cup (125 mg).  Summer squash  cup (175-200 mg).  Swiss chard, raw, 1 cup (135 mg).  Tangerines, 1 each (140 mg).  Tea, brewed, 6 oz (65 mg).  Turnips,  cup (140 mg).  Watermelon,  cup (85 mg).  Wine, red, table, 5 oz (180 mg).  Wine, white, table, 5 oz (100 mg). LOW IN POTASSIUM The following foods and beverages have less than 50 mg of potassium per serving.  Bread, white, 1 slice (30 mg).  Carbonated beverages, 12 oz (less than 5 mg).  Cheese, 1 oz (20-30 mg).  Cranberries,  cup (45 mg).  Cranberry juice cocktail,  cup (20 mg).  Fats and oils, 1 Tbsp (less than 5 mg).  Hummus, 1 Tbsp (32 mg).  Nectar: papaya, mango, or pear,  cup (35 mg).  Rice, white or brown,  cup (50 mg).  Spaghetti or macaroni,  cup cooked (30 mg).  Tortilla, flour or corn, 1 each (50  mg).  Waffle, 4 in., 1 each (50 mg).  Water chestnuts,  cup (40 mg).   This information is not intended to replace advice given to you by your health care provider. Make sure you discuss any questions you have with your health care provider.   Document Released: 11/07/2004 Document Revised: 03/31/2013 Document Reviewed: 02/20/2013 Elsevier Interactive Patient Education 2016 [employer].  Hypertension Hypertension, commonly called high blood pressure, is when the force of blood pumping through your arteries is too strong. Your arteries are  the blood vessels that carry blood from your heart throughout your body. A blood pressure reading consists of a higher number over a lower number, such as 110/72. The higher number (systolic) is the pressure inside your arteries when your heart pumps. The lower number (diastolic) is the pressure inside your arteries when your heart relaxes. Ideally you want your blood pressure below 120/80. Hypertension forces your heart to work harder to pump blood. Your arteries may become narrow or stiff. Having untreated or uncontrolled hypertension can cause heart attack, stroke, kidney disease, and other problems. RISK FACTORS Some risk factors for high blood pressure are controllable. Others are not.  Risk factors you cannot control include:   Race. You may be at higher risk if you are African American.  Age. Risk increases with age.  Gender. Men are at higher risk than women before age 66 years. After age 10, women are at higher risk than men. Risk factors you can control include:  Not getting enough exercise or physical activity.  Being overweight.  Getting too much fat, sugar, calories, or salt in your diet.  Drinking too much alcohol. SIGNS AND SYMPTOMS Hypertension does not usually cause signs or symptoms. Extremely high blood pressure (hypertensive crisis) may cause headache, anxiety, shortness of breath, and nosebleed. DIAGNOSIS To check if you  have hypertension, your health care provider will measure your blood pressure while you are seated, with your arm held at the level of your heart. It should be measured at least twice using the same arm. Certain conditions can cause a difference in blood pressure between your right and left arms. A blood pressure reading that is higher than normal on one occasion does not mean that you need treatment. If it is not clear whether you have high blood pressure, you may be asked to return on a different day to have your blood pressure checked again. Or, you may be asked to monitor your blood pressure at home for 1 or more weeks. TREATMENT Treating high blood pressure includes making lifestyle changes and possibly taking medicine. Living a healthy lifestyle can help lower high blood pressure. You may need to change some of your habits. Lifestyle changes may include:  Following the DASH diet. This diet is high in fruits, vegetables, and whole grains. It is low in salt, red meat, and added sugars.  Keep your sodium intake below 2,300 mg per day.  Getting at least 30-45 minutes of aerobic exercise at least 4 times per week.  Losing weight if necessary.  Not smoking.  Limiting alcoholic beverages.  Learning ways to reduce stress. Your health care provider may prescribe medicine if lifestyle changes are not enough to get your blood pressure under control, and if one of the following is true:  You are 39-7 years of age and your systolic blood pressure is above 140.  You are 77 years of age or older, and your systolic blood pressure is above 150.  Your diastolic blood pressure is above 90.  You have diabetes, and your systolic blood pressure is over [id number] or your diastolic blood pressure is over 90.  You have kidney disease and your blood pressure is above 140/90.  You have heart disease and your blood pressure is above 140/90. Your personal target blood pressure may vary depending on your medical  conditions, your age, and other factors. HOME CARE INSTRUCTIONS  Have your blood pressure rechecked as directed by your health care provider.   Take medicines only as directed by your  health care provider. Follow the directions carefully. Blood pressure medicines must be taken as prescribed. The medicine does not work as well when you skip doses. Skipping doses also puts you at risk for problems.  Do not smoke.   Monitor your blood pressure at home as directed by your health care provider. SEEK MEDICAL CARE IF:   You think you are having a reaction to medicines taken.  You have recurrent headaches or feel dizzy.  You have swelling in your ankles.  You have trouble with your vision. SEEK IMMEDIATE MEDICAL CARE IF:  You develop a severe headache or confusion.  You have unusual weakness, numbness, or feel faint.  You have severe chest or abdominal pain.  You vomit repeatedly.  You have trouble breathing. MAKE SURE YOU:   Understand these instructions.  Will watch your condition.  Will get help right away if you are not doing well or get worse.   This information is not intended to replace advice given to you by your health care provider. Make sure you discuss any questions you have with your health care provider.   Document Released: 03/26/2005 Document Revised: 08/10/2014 Document Reviewed: 01/16/2013 Elsevier Interactive Patient Education 2016 [place] DASH stands for "Dietary Approaches to Stop Hypertension." The DASH eating plan is a healthy eating plan that has been shown to reduce high blood pressure (hypertension). Additional health benefits may include reducing the risk of type 2 diabetes mellitus, heart disease, and stroke. The DASH eating plan may also help with weight loss. WHAT DO I NEED TO KNOW ABOUT THE DASH EATING PLAN? For the DASH eating plan, you will follow these general guidelines:  Choose foods with a percent daily value for  sodium of less than 5% (as listed on the food label).  Use salt-free seasonings or herbs instead of table salt or sea salt.  Check with your health care provider or pharmacist before using salt substitutes.  Eat lower-sodium products, often labeled as "lower sodium" or "no salt added."  Eat fresh foods.  Eat more vegetables, fruits, and low-fat dairy products.  Choose whole grains. Look for the word "whole" as the first word in the ingredient list.  Choose fish and skinless chicken or [place] more often than red meat. Limit fish, poultry, and meat to 6 oz (170 g) each day.  Limit sweets, desserts, sugars, and sugary drinks.  Choose heart-healthy fats.  Limit cheese to 1 oz (28 g) per day.  Eat more home-cooked food and less restaurant, buffet, and fast food.  Limit fried foods.  Cook foods using methods other than frying.  Limit canned vegetables. If you do use them, rinse them well to decrease the sodium.  When eating at a restaurant, ask that your food be prepared with less salt, or no salt if possible. WHAT FOODS CAN I EAT? Seek help from a dietitian for individual calorie needs. Grains Whole grain or whole wheat bread. Brown rice. Whole grain or whole wheat pasta. Quinoa, bulgur, and whole grain cereals. Low-sodium cereals. Corn or whole wheat flour tortillas. Whole grain cornbread. Whole grain crackers. Low-sodium crackers. Vegetables Fresh or frozen vegetables (raw, steamed, roasted, or grilled). Low-sodium or reduced-sodium tomato and vegetable juices. Low-sodium or reduced-sodium tomato sauce and paste. Low-sodium or reduced-sodium canned vegetables.  Fruits All fresh, canned (in natural juice), or frozen fruits. Meat and Other Protein Products Ground beef (85% or leaner), grass-fed beef, or beef trimmed of fat. Skinless chicken or [place]. Ground chicken or  [place]. Pork trimmed of fat. All fish and seafood. Eggs. Dried beans, peas, or lentils. Unsalted nuts and seeds.  Unsalted canned beans. Dairy Low-fat dairy products, such as skim or 1% milk, 2% or reduced-fat cheeses, low-fat ricotta or cottage cheese, or plain low-fat yogurt. Low-sodium or reduced-sodium cheeses. Fats and Oils Tub margarines without trans fats. Light or reduced-fat mayonnaise and salad dressings (reduced sodium). Avocado. Safflower, olive, or canola oils. Natural peanut or almond butter. Other Unsalted popcorn and pretzels. The items listed above may not be a complete list of recommended foods or beverages. Contact your dietitian for more options. WHAT FOODS ARE NOT RECOMMENDED? Grains White bread. White pasta. White rice. Refined cornbread. Bagels and croissants. Crackers that contain trans fat. Vegetables Creamed or fried vegetables. Vegetables in a cheese sauce. Regular canned vegetables. Regular canned tomato sauce and paste. Regular tomato and vegetable juices. Fruits Dried fruits. Canned fruit in light or heavy syrup. Fruit juice. Meat and Other Protein Products Fatty cuts of meat. Ribs, chicken wings, bacon, sausage, bologna, salami, chitterlings, fatback, hot dogs, bratwurst, and packaged luncheon meats. Salted nuts and seeds. Canned beans with salt. Dairy Whole or 2% milk, cream, half-and-half, and cream cheese. Whole-fat or sweetened yogurt. Full-fat cheeses or blue cheese. Nondairy creamers and whipped toppings. Processed cheese, cheese spreads, or cheese curds. Condiments Onion and garlic salt, seasoned salt, table salt, and sea salt. Canned and packaged gravies. Worcestershire sauce. Tartar sauce. Barbecue sauce. Teriyaki sauce. Soy sauce, including reduced sodium. Steak sauce. Fish sauce. Oyster sauce. Cocktail sauce. Horseradish. Ketchup and mustard. Meat flavorings and tenderizers. Bouillon cubes. Hot sauce. Tabasco sauce. Marinades. Taco seasonings. Relishes. Fats and Oils Butter, stick margarine, lard, shortening, ghee, and bacon fat. Coconut, palm kernel, or palm oils.  Regular salad dressings. Other Pickles and olives. Salted popcorn and pretzels. The items listed above may not be a complete list of foods and beverages to avoid. Contact your dietitian for more information. WHERE CAN I FIND MORE INFORMATION? National Heart, Lung, and Blood Institute: travelstabloid.com   This information is not intended to replace advice given to you by your health care provider. Make sure you discuss any questions you have with your health care provider.   Document Released: 03/15/2011 Document Revised: 04/16/2014 Document Reviewed: 01/28/2013 Elsevier Interactive Patient Education [employer].

## 2015-05-09 NOTE — ED Provider Notes (Signed)
CSN: [id number]     Arrival date & time 05/09/15  1954 History   First MD Initiated Contact with Patient 05/09/15 2145     Chief Complaint  Patient presents with  . Vaginal Discharge  . Abdominal Pain     (Consider location/radiation/quality/duration/timing/severity/associated sxs/prior Treatment) HPI Comments: Madison Castillo is a 40 y.o. female with a PMHx of remote chlamydia, with a PSHx of tubal ligation and c-section, who presents to the ED with complaints of suprapubic abdominal pain 2 days along with vaginal itching, dysuria, and increased urinary frequency. She describes the abdominal pain is 6/10 intermittent sharp suprapubic pain which is nonradiating, worse with urination, and improved somewhat with Advil. She states that she recently changed soaps and believes that this could be contributing to the vaginal itching. Nursing note reports vaginal discharge, but patient denies that this is true. LMP was in December 2016, she is unsure of exact date. She is sexually active with one female partner, unprotected.  She denies any fevers, chills, chest pain, shortness of breath, nausea, vomiting, diarrhea, constipation, obstipation, melena, hematochezia, hematuria, vaginal bleeding or discharge, numbness, tingling, weakness, recent travel, sick contacts, suspicious food intake, recent antibiotics, alcohol use, or chronic NSAID use.  Patient is a 40 y.o. female presenting with abdominal pain. The history is provided by the patient. No language interpreter was used.  Abdominal Pain Pain location:  Suprapubic Pain quality: sharp   Pain radiates to:  Does not radiate Pain severity:  Moderate Onset quality:  Gradual Duration:  2 days Timing:  Intermittent Progression:  Unchanged Chronicity:  New Context: not recent travel, not sick contacts and not suspicious food intake   Relieved by:  NSAIDs Worsened by:  Urination Ineffective treatments:  None tried Associated symptoms: dysuria     Associated symptoms: no chest pain, no chills, no constipation, no diarrhea, no fever, no flatus, no hematemesis, no hematochezia, no hematuria, no melena, no nausea, no shortness of breath, no vaginal bleeding, no vaginal discharge and no vomiting   Risk factors: no alcohol abuse and no NSAID use     Past Medical History  Diagnosis Date  . Chlamydia   . No pertinent past medical history    Past Surgical History  Procedure Laterality Date  . Cesarean section    . Tubal ligation  2008   Family History  Problem Relation Age of Onset  . Hypertension Mother   . Diabetes Mother   . Hypertension Maternal Grandmother   . Heart disease Maternal Grandmother   . Hypertension Maternal Grandfather   . Heart disease Maternal Grandfather   . Other Neg Hx    Social History  Substance Use Topics  . Smoking status: Never Smoker   . Smokeless tobacco: None  . Alcohol Use: Yes     Comment: occassional at family functions   OB History    Gravida Para Term Preterm AB TAB SAB Ectopic Multiple Living   15 13 8 5 2  2   7      Review of Systems  Constitutional: Negative for fever and chills.  Respiratory: Negative for shortness of breath.   Cardiovascular: Negative for chest pain.  Gastrointestinal: Positive for abdominal pain. Negative for nausea, vomiting, diarrhea, constipation, blood in stool, melena, hematochezia, flatus and hematemesis.  Genitourinary: Positive for dysuria, frequency and vaginal pain (itching). Negative for hematuria, vaginal bleeding and vaginal discharge.  Musculoskeletal: Negative for myalgias and arthralgias.  Skin: Negative for color change.  Allergic/Immunologic: Negative for immunocompromised state.  Neurological:  Negative for weakness and numbness.  Psychiatric/Behavioral: Negative for confusion.   10 Systems reviewed and are negative for acute change except as noted in the HPI.    Allergies  Review of patient's allergies indicates no known  allergies.  Home Medications   Prior to Admission medications   Medication Sig Start Date End Date Taking? Authorizing Provider  ibuprofen (ADVIL) 200 MG tablet Take 400 mg by mouth every 6 (six) hours as needed for moderate pain.   Yes Historical Provider, MD  naproxen (NAPROSYN) 500 MG tablet Take 1 tablet (500 mg total) by mouth 2 (two) times daily. Patient not taking: Reported on 05/09/2015 09/12/14   Gregor Hams, MD   BP 166/88 mmHg  Pulse 89  Temp(Src) 98.5 F (36.9 C) (Oral)  Resp 18  SpO2 98%  LMP 02/08/2015 Physical Exam  Constitutional: She is oriented to person, place, and time. Vital signs are normal. She appears well-developed and well-nourished.  Non-toxic appearance. No distress.  Afebrile, nontoxic, NAD. Playing a crossword puzzle during entire exam  HENT:  Head: Normocephalic and atraumatic.  Mouth/Throat: Oropharynx is clear and moist and mucous membranes are normal.  Eyes: Conjunctivae and EOM are normal. Right eye exhibits no discharge. Left eye exhibits no discharge.  Neck: Normal range of motion. Neck supple.  Cardiovascular: Normal rate, regular rhythm, normal heart sounds and intact distal pulses.  Exam reveals no gallop and no friction rub.   No murmur heard. Pulmonary/Chest: Effort normal and breath sounds normal. No respiratory distress. She has no decreased breath sounds. She has no wheezes. She has no rhonchi. She has no rales.  Abdominal: Soft. Normal appearance and bowel sounds are normal. She exhibits no distension. There is tenderness in the suprapubic area. There is no rigidity, no rebound, no guarding, no CVA tenderness, no tenderness at McBurney's point and negative Murphy's sign.    Soft, obese which limits exam, but without obvious distension, +BS throughout, with minimal suprapubic TTP, no r/g/r, neg murphy's, neg mcburney's, no CVA TTP   Genitourinary: Uterus normal. Pelvic exam was performed with patient supine. There is no rash, tenderness or  lesion on the right labia. There is no rash, tenderness or lesion on the left labia. Cervix exhibits discharge. Cervix exhibits no motion tenderness and no friability. Right adnexum displays no mass, no tenderness and no fullness. Left adnexum displays no mass, no tenderness and no fullness. There is erythema and tenderness in the vagina. No bleeding in the vagina. No foreign body around the vagina. Vaginal discharge found.  Chaperone present for exam. No rashes, lesions, or tenderness to external genitalia. Mild erythema and tenderness to vaginal mucosa, no injury noted. +Mucoid/white vaginal discharge without bleeding within vaginal vault. No adnexal masses, tenderness, or fullness. No CMT or cervical friability, + mucoid discharge from cervical os. Uterus non-deviated, mobile, nonTTP, and without enlargement.    Musculoskeletal: Normal range of motion.  Neurological: She is alert and oriented to person, place, and time. She has normal strength. No sensory deficit.  Skin: Skin is warm, dry and intact. No rash noted.  Psychiatric: She has a normal mood and affect.  Nursing note and vitals reviewed.   ED Course  Procedures (including critical care time) Labs Review Labs Reviewed  WET PREP, GENITAL - Abnormal; Notable for the following:    Yeast Wet Prep HPF POC MANY (*)    Clue Cells Wet Prep HPF POC MANY (*)    WBC, Wet Prep HPF POC MANY (*)  All other components within normal limits  COMPREHENSIVE METABOLIC PANEL - Abnormal; Notable for the following:    Potassium 3.3 (*)    Calcium 8.7 (*)    Total Bilirubin 0.2 (*)    All other components within normal limits  URINALYSIS, ROUTINE W REFLEX MICROSCOPIC (NOT AT [facility]) - Abnormal; Notable for the following:    APPearance TURBID (*)    Hgb urine dipstick LARGE (*)    Protein, ur 30 (*)    Leukocytes, UA LARGE (*)    All other components within normal limits  URINE MICROSCOPIC-ADD ON - Abnormal; Notable for the following:    Squamous  Epithelial / LPF TOO NUMEROUS TO COUNT (*)    Bacteria, UA MANY (*)    All other components within normal limits  URINE CULTURE  LIPASE, BLOOD  CBC  PREGNANCY, URINE  RPR  HIV ANTIBODY (ROUTINE TESTING)  GC/CHLAMYDIA PROBE AMP ([place]) NOT AT [facility]    Imaging Review No results found. I have personally reviewed and evaluated these images and lab results as part of my medical decision-making.   EKG Interpretation None      MDM   Final diagnoses:  Yeast vaginitis  UTI (lower urinary tract infection)  Suprapubic abdominal pain, unspecified laterality  Unprotected sexual intercourse  Vaginal itching  Hypokalemia  HTN (hypertension), benign  Obesity  BV (bacterial vaginosis)    40 y.o. female here with suprapubic pain, vaginal itching, dysuria, and increased urinary frequency x2 days. Nursing note states vaginal discharge also reported, but pt denies this to me. On exam, suprapubic TTP, nonperitoneal. Will proceed with pelvic exam to fully evaluate lower abd tenderness. Labs reveal lipase WNL, CMP with mildly low K 3.3 doubt need for repletion, otherwise WNL. CBC WNL. U/A with large leuks, TNTC squamous, TNTC WBC and RBCs with many bacteria and +yeast. Will send for UCulture but likely treat given her symptoms. Will get STD testing and perform pelvic. Will also add-on Upreg. Will reassess shortly. Pt declines wanting pain meds.   10:22 PM Pelvic reveals +mucoid/white discharge in vault and from cervix, will empirically treat for GC/CT. Will treat for yeast since this showed up on U/A, and exam is consistent with this (some erythema and tenderness to vaginal vault). No CMT or friability. Will await Upreg before giving meds (ordered diflucan, rocephin, and azithromycin, advised nursing to hold on these until Upreg returns and we're sure it's neg). Will await wet prep then reassess shortly.   10:49 PM Wet prep with many yeast, many clue cells, and many WBC. Will treat for BV as  well. Upreg neg. Will proceed with empiric GC/CT treatment, and diflucan here. Will send home with keflex for UTI (UCx sent), and flagyl for BV. Discussed avoidance of sexual intercourse until she finds out results of STD testing. Also discussed monitoring BP since pt here was found to be mildly HTN, no symptoms and doubt need for further work up, but DASH diet and exercise discussed. F/up with PCP and OBGYN in 1wk for recheck. I explained the diagnosis and have given explicit precautions to return to the ER including for any other new or worsening symptoms. The patient understands and accepts the medical plan as it's been dictated and I have answered their questions. Discharge instructions concerning home care and prescriptions have been given. The patient is STABLE and is discharged to home in good condition.   BP 166/88 mmHg  Pulse 89  Temp(Src) 98.5 F (36.9 C) (Oral)  Resp 18  SpO2 98%  LMP 02/08/2015  Meds ordered this encounter  Medications  . azithromycin (ZITHROMAX) tablet 1,000 mg    Sig:    And  . cefTRIAXone (ROCEPHIN) injection 250 mg    Sig:     Order Specific Question:  Antibiotic Indication:    Answer:  STD  . fluconazole (DIFLUCAN) tablet 150 mg    Sig:   . cephALEXin (KEFLEX) 500 MG capsule    Sig: 2 caps po bid x 7 days    Dispense:  28 capsule    Refill:  0    Order Specific Question:  Supervising Provider    Answer:  MILLER, BRIAN [[id number]]  . metroNIDAZOLE (FLAGYL) 500 MG tablet    Sig: Take 1 tablet (500 mg total) by mouth 2 (two) times daily. One po bid x 7 days    Dispense:  14 tablet    Refill:  0    Order Specific Question:  Supervising Provider    Answer:  Sabra Heck, BRIAN [[id number]]  . lidocaine (PF) (XYLOCAINE) 1 % injection    Sig:     [place] Sl[place]   : cabinet override     Zacarias Pontes, PA-C 05/09/15 [place], MD 05/10/15 [phone]

## 2015-05-10 LAB — GC/CHLAMYDIA PROBE AMP (~~LOC~~) NOT AT ARMC
Chlamydia: NEGATIVE
NEISSERIA GONORRHEA: NEGATIVE

## 2015-05-10 LAB — RPR: RPR Ser Ql: NONREACTIVE

## 2015-05-10 LAB — HIV ANTIBODY (ROUTINE TESTING W REFLEX): HIV SCREEN 4TH GENERATION: NONREACTIVE

## 2015-05-11 ENCOUNTER — Telehealth (HOSPITAL_COMMUNITY): Payer: Self-pay

## 2015-05-11 LAB — URINE CULTURE

## 2015-07-18 ENCOUNTER — Encounter (HOSPITAL_COMMUNITY): Payer: Self-pay | Admitting: Emergency Medicine

## 2015-07-18 ENCOUNTER — Emergency Department (HOSPITAL_COMMUNITY): Payer: Self-pay

## 2015-07-18 ENCOUNTER — Emergency Department (HOSPITAL_COMMUNITY)
Admission: EM | Admit: 2015-07-18 | Discharge: 2015-07-19 | Disposition: A | Discharge status: Home / Self Care | Payer: Self-pay | Attending: Emergency Medicine | Admitting: Emergency Medicine

## 2015-07-18 DIAGNOSIS — S63619A Unspecified sprain of unspecified finger, initial encounter: Secondary | ICD-10-CM

## 2015-07-18 DIAGNOSIS — Y998 Other external cause status: Secondary | ICD-10-CM | POA: Insufficient documentation

## 2015-07-18 DIAGNOSIS — Y9389 Activity, other specified: Secondary | ICD-10-CM | POA: Insufficient documentation

## 2015-07-18 DIAGNOSIS — S8001XA Contusion of right knee, initial encounter: Secondary | ICD-10-CM | POA: Insufficient documentation

## 2015-07-18 DIAGNOSIS — S4991XA Unspecified injury of right shoulder and upper arm, initial encounter: Secondary | ICD-10-CM | POA: Insufficient documentation

## 2015-07-18 DIAGNOSIS — S63612A Unspecified sprain of right middle finger, initial encounter: Secondary | ICD-10-CM | POA: Insufficient documentation

## 2015-07-18 DIAGNOSIS — Z8619 Personal history of other infectious and parasitic diseases: Secondary | ICD-10-CM | POA: Insufficient documentation

## 2015-07-18 DIAGNOSIS — J45909 Unspecified asthma, uncomplicated: Secondary | ICD-10-CM | POA: Insufficient documentation

## 2015-07-18 DIAGNOSIS — S4992XA Unspecified injury of left shoulder and upper arm, initial encounter: Secondary | ICD-10-CM | POA: Insufficient documentation

## 2015-07-18 DIAGNOSIS — S239XXA Sprain of unspecified parts of thorax, initial encounter: Secondary | ICD-10-CM | POA: Insufficient documentation

## 2015-07-18 DIAGNOSIS — Y92410 Unspecified street and highway as the place of occurrence of the external cause: Secondary | ICD-10-CM | POA: Insufficient documentation

## 2015-07-18 HISTORY — DX: Unspecified asthma, uncomplicated: J45.909
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
## 2015-07-19 MED ORDER — CYCLOBENZAPRINE HCL 10 MG PO TABS
5.0000 mg | ORAL_TABLET | Freq: Once | ORAL | Status: DC
  Filled 2015-07-19: qty 1

## 2015-07-19 MED ORDER — TRAMADOL HCL 50 MG PO TABS: 50.0000 mg | ORAL_TABLET | Freq: Four times a day (QID) | ORAL | Status: DC | PRN

## 2015-07-19 MED ORDER — CYCLOBENZAPRINE HCL 5 MG PO TABS: 5.0000 mg | ORAL_TABLET | Freq: Three times a day (TID) | ORAL | Status: DC | PRN

## 2015-07-19 MED ORDER — TRAMADOL HCL 50 MG PO TABS
50.0000 mg | ORAL_TABLET | Freq: Once | ORAL | Status: DC
  Filled 2015-07-19: qty 1

## 2015-07-19 NOTE — Discharge Instructions (Signed)
Your x ray is normal You are being treated for a contusion or deep bruise Your finger has been placed in a splint- call and make an appointment with Dr. Grandville Silos for further evaluation   Contusion A contusion is a deep bruise. Contusions happen when an injury causes bleeding under the skin. Symptoms of bruising include pain, swelling, and discolored skin. The skin may turn blue, purple, or yellow. HOME CARE   Rest the injured area.  If told, put ice on the injured area.  Put ice in a plastic bag.  Place a towel between your skin and the bag.  Leave the ice on for 20 minutes, 2-3 times per day.  If told, put light pressure (compression) on the injured area using an elastic bandage. Make sure the bandage is not too tight. Remove it and put it back on as told by your doctor.  If possible, raise (elevate) the injured area above the level of your heart while you are sitting or lying down.  Take over-the-counter and prescription medicines only as told by your doctor. GET HELP IF:  Your symptoms do not get better after several days of treatment.  Your symptoms get worse.  You have trouble moving the injured area. GET HELP RIGHT AWAY IF:   You have very bad pain.  You have a loss of feeling (numbness) in a hand or foot.  Your hand or foot turns pale or cold.   This information is not intended to replace advice given to you by your health care provider. Make sure you discuss any questions you have with your health care provider.   Document Released: 09/12/2007 Document Revised: 12/15/2014 Document Reviewed: 08/11/2014 Elsevier Interactive Patient Education [employer].

## 2019-09-03 ENCOUNTER — Other Ambulatory Visit: Payer: Self-pay | Admitting: Physician Assistant

## 2019-09-03 DIAGNOSIS — Z1231 Encounter for screening mammogram for malignant neoplasm of breast: Secondary | ICD-10-CM

## 2019-09-25 ENCOUNTER — Ambulatory Visit: Payer: Self-pay

## 2020-02-18 ENCOUNTER — Encounter (HOSPITAL_COMMUNITY): Payer: Self-pay

## 2020-02-18 ENCOUNTER — Other Ambulatory Visit: Payer: Self-pay

## 2020-02-18 ENCOUNTER — Emergency Department (HOSPITAL_COMMUNITY)
Admission: EM | Admit: 2020-02-18 | Discharge: 2020-02-19 | Disposition: A | Discharge status: Home / Self Care | Payer: Medicaid Other | Attending: Emergency Medicine | Admitting: Emergency Medicine

## 2020-02-18 DIAGNOSIS — J45909 Unspecified asthma, uncomplicated: Secondary | ICD-10-CM | POA: Diagnosis not present

## 2020-02-18 DIAGNOSIS — R002 Palpitations: Secondary | ICD-10-CM | POA: Diagnosis not present

## 2020-02-18 LAB — CBC
HCT: 38.7 % (ref 36.0–46.0)
Hemoglobin: 12.1 g/dL (ref 12.0–15.0)
MCH: 26.6 pg (ref 26.0–34.0)
MCHC: 31.3 g/dL (ref 30.0–36.0)
MCV: 85.1 fL (ref 80.0–100.0)
Platelets: 252 10*3/uL (ref 150–400)
RBC: 4.55 MIL/uL (ref 3.87–5.11)
RDW: 13.2 % (ref 11.5–15.5)
WBC: 7.8 10*3/uL (ref 4.0–10.5)
nRBC: 0 % (ref 0.0–0.2)

## 2020-02-18 LAB — BASIC METABOLIC PANEL
Anion gap: 10 (ref 5–15)
BUN: 7 mg/dL (ref 6–20)
CO2: 25 mmol/L (ref 22–32)
Calcium: 9.2 mg/dL (ref 8.9–10.3)
Chloride: 102 mmol/L (ref 98–111)
Creatinine, Ser: 0.58 mg/dL (ref 0.44–1.00)
GFR, Estimated: >60 mL/min (ref >60)
Glucose, Bld: 127 mg/dL — ABNORMAL HIGH (ref 70–99)
Potassium: 3.7 mmol/L (ref 3.5–5.1)
Sodium: 137 mmol/L (ref 135–145)

## 2020-02-18 NOTE — ED Notes (Signed)
Pt reports intermittent episodes of chest tightness and fluttering along with lightheadedness. Pt denies experiencing any of those s/s at this time. EKG is NSR at this time and pt is in NAD at this time.

## 2020-02-18 NOTE — ED Triage Notes (Signed)
Pt arrives to ED w/ c/o intermittent palpitations and lethargy x 4 days. Pt states she "just doesn't feel right". Resp e/u, appears in NAD.

## 2020-02-19 MED ORDER — ACETAMINOPHEN 500 MG PO TABS
1000.0000 mg | ORAL_TABLET | Freq: Once | ORAL | Status: AC
  Administered 2020-02-19: 1000 mg via ORAL
  Filled 2020-02-19: qty 2

## 2020-02-19 NOTE — ED Notes (Signed)
Pt is d/c by MD and is provided w/ d/c instructions and follow up care, Pt is out of the ED Ambulatory with family

## 2020-02-19 NOTE — Discharge Instructions (Signed)
We recommend continued follow-up with your primary care doctor as well as cardiology, as planned. Your evaluation in the ED today has been reassuring. You may return for new or concerning symptoms.

## 2020-02-22 NOTE — ED Provider Notes (Signed)
[facility] EMERGENCY DEPARTMENT Provider Note   CSN: [id number] Arrival date & time: 02/18/20  2007     History Chief Complaint  Patient presents with  . Palpitations    Madison Castillo is a 44 y.o. female.   Palpitations Palpitations quality: sensation of "fluttering" Duration: >1 weeks. Timing:  Sporadic Progression:  Waxing and waning Chronicity:  New Context: not caffeine, not illicit drugs and not stimulant use   Relieved by:  Nothing Associated symptoms: malaise/fatigue   Associated symptoms: no chest pain, no diaphoresis, no lower extremity edema, no syncope and no vomiting   Associated symptoms comment:  Also associated with mild chest tightness Risk factors: no heart disease, no hx of atrial fibrillation, no hx of DVT and no hx of PE   Risk factors comment:  Obesity      Past Medical History:  Diagnosis Date  . Asthma   . Chlamydia   . No pertinent past medical history     There are no problems to display for this patient.   Past Surgical History:  Procedure Laterality Date  . CESAREAN SECTION    . TUBAL LIGATION  2008     OB History    Gravida  15   Para  46   Term  8   Preterm  5   AB  2   Living  7     SAB  2   TAB      Ectopic      Multiple      Live Births              Family History  Problem Relation Age of Onset  . Hypertension Mother   . Diabetes Mother   . Hypertension Maternal Grandmother   . Heart disease Maternal Grandmother   . Hypertension Maternal Grandfather   . Heart disease Maternal Grandfather   . Other Neg Hx     Social History   Tobacco Use  . Smoking status: Never Smoker  Substance Use Topics  . Alcohol use: Yes    Comment: occassional at family functions  . Drug use: No    Home Medications Prior to Admission medications   Medication Sig Start Date End Date Taking? Authorizing Provider  cephALEXin (KEFLEX) 500 MG capsule 2 caps po bid x 7 days 05/09/15   Street,  [place], PA-C  ibuprofen (ADVIL) 200 MG tablet Take 400 mg by mouth every 6 (six) hours as needed for moderate pain.    [provider]  metroNIDAZOLE (FLAGYL) 500 MG tablet Take 1 tablet (500 mg total) by mouth 2 (two) times daily. One po bid x 7 days 05/09/15   Street, [place], PA-C  naproxen (NAPROSYN) 500 MG tablet Take 1 tablet (500 mg total) by mouth 2 (two) times daily. Patient not taking: Reported on 05/09/2015 09/12/14   Gregor Hams, MD    Allergies    Patient has no known allergies.  Review of Systems   Review of Systems  Constitutional: Positive for malaise/fatigue. Negative for diaphoresis.  Cardiovascular: Positive for palpitations. Negative for chest pain and syncope.  Gastrointestinal: Negative for vomiting.  Ten systems reviewed and are negative for acute change, except as noted in the HPI.    Physical Exam Updated Vital Signs BP (!) 158/78   Pulse 71   Temp 98.1 F (36.7 C) (Oral)   Resp 18   SpO2 100%   Physical Exam Vitals and nursing note reviewed.  Constitutional:  General: She is not in acute distress.    Appearance: She is well-developed. She is not diaphoretic.     Comments: Obese, pleasant.  HENT:     Head: Normocephalic and atraumatic.  Eyes:     General: No scleral icterus.    Conjunctiva/sclera: Conjunctivae normal.  Cardiovascular:     Rate and Rhythm: Normal rate and regular rhythm.     Pulses: Normal pulses.  Pulmonary:     Effort: Pulmonary effort is normal. No respiratory distress.     Comments: Respirations even and unlabored Musculoskeletal:        General: Normal range of motion.     Cervical back: Normal range of motion.  Skin:    General: Skin is warm and dry.     Coloration: Skin is not pale.     Findings: No erythema or rash.  Neurological:     Mental Status: She is alert and oriented to person, place, and time.     Coordination: Coordination normal.  Psychiatric:        Behavior: Behavior normal.     ED  Results / Procedures / Treatments   Labs (all labs ordered are listed, but only abnormal results are displayed) Labs Reviewed  BASIC METABOLIC PANEL - Abnormal; Notable for the following components:      Result Value   Glucose, Bld 127 (*)    All other components within normal limits  CBC    EKG EKG Interpretation  Date/Time:  Thursday February 18 2020 20:07:18 EST Ventricular Rate:  94 PR Interval:  162 QRS Duration: 82 QT Interval:  356 QTC Calculation: 445 R Axis:   -25 Text Interpretation: Normal sinus rhythm Cannot rule out Anterior infarct , age undetermined Abnormal ECG No old tracing to compare Confirmed by Sherwood Gambler [phone]) on 02/18/2020 9:25:09 PM   Radiology No results found.  Procedures Procedures (including critical care time)  Medications Ordered in ED Medications  acetaminophen (TYLENOL) tablet 1,000 mg (1,000 mg Oral Given 02/19/20 0103)    ED Course  I have reviewed the triage vital signs and the nursing notes.  Pertinent labs & imaging results that were available during my care of the patient were reviewed by me and considered in my medical decision making (see chart for details).    MDM Rules/Calculators/A&P                          44 year old female presents to the emergency department for evaluation of fatigue and chest tightness associated with palpitations.  She characterizes her palpitations as a fluttering sensation.  Triage note references symptoms being present x4 days, but she states that they have been going on for at least 1 week.  She has seen her primary care doctor for this and was recommended referral to cardiology.  Currently asymptomatic.  Is calm, cooperative, pleasant.  Noted to be afebrile.  Vitals stable.  Work-up in the ED today has been reassuring.  EKG without arrhythmia.  Do agree with recommendation for cardiology follow-up.  Would benefit from completion of a Holter monitor for further symptom characterization.   Advised to avoid stimulants such as caffeine, sugary beverages.  She will follow up with her primary care doctor in the interim.  Discharged in stable condition.   Final Clinical Impression(s) / ED Diagnoses Final diagnoses:  Palpitations    Rx / DC Orders ED Discharge Orders    None       Antonietta Breach, PA-C  55/97/41 [id number]    Delora Fuel, MD 53/64/68 0730

## 2020-07-24 ENCOUNTER — Emergency Department (HOSPITAL_COMMUNITY): Payer: Medicaid Other

## 2020-07-24 ENCOUNTER — Inpatient Hospital Stay (HOSPITAL_COMMUNITY)
Admission: EM | Admit: 2020-07-24 | Discharge: 2020-08-07 | DRG: 308 | Disposition: E | Payer: Medicaid Other | Attending: Internal Medicine | Admitting: Internal Medicine

## 2020-07-24 DIAGNOSIS — E872 Acidosis: Secondary | ICD-10-CM | POA: Diagnosis present

## 2020-07-24 DIAGNOSIS — Z20822 Contact with and (suspected) exposure to covid-19: Secondary | ICD-10-CM | POA: Diagnosis present

## 2020-07-24 DIAGNOSIS — G936 Cerebral edema: Secondary | ICD-10-CM | POA: Diagnosis present

## 2020-07-24 DIAGNOSIS — Z8249 Family history of ischemic heart disease and other diseases of the circulatory system: Secondary | ICD-10-CM

## 2020-07-24 DIAGNOSIS — Z515 Encounter for palliative care: Secondary | ICD-10-CM

## 2020-07-24 DIAGNOSIS — J45909 Unspecified asthma, uncomplicated: Secondary | ICD-10-CM | POA: Diagnosis present

## 2020-07-24 DIAGNOSIS — Z9911 Dependence on respirator [ventilator] status: Secondary | ICD-10-CM

## 2020-07-24 DIAGNOSIS — J9601 Acute respiratory failure with hypoxia: Secondary | ICD-10-CM | POA: Diagnosis present

## 2020-07-24 DIAGNOSIS — R570 Cardiogenic shock: Secondary | ICD-10-CM | POA: Diagnosis present

## 2020-07-24 DIAGNOSIS — I472 Ventricular tachycardia: Secondary | ICD-10-CM | POA: Diagnosis present

## 2020-07-24 DIAGNOSIS — Z66 Do not resuscitate: Secondary | ICD-10-CM | POA: Diagnosis not present

## 2020-07-24 DIAGNOSIS — G9382 Brain death: Secondary | ICD-10-CM | POA: Diagnosis not present

## 2020-07-24 DIAGNOSIS — I462 Cardiac arrest due to underlying cardiac condition: Secondary | ICD-10-CM | POA: Diagnosis present

## 2020-07-24 DIAGNOSIS — R680 Hypothermia, not associated with low environmental temperature: Secondary | ICD-10-CM | POA: Diagnosis present

## 2020-07-24 DIAGNOSIS — R079 Chest pain, unspecified: Secondary | ICD-10-CM

## 2020-07-24 DIAGNOSIS — R4020 Unspecified coma: Secondary | ICD-10-CM | POA: Diagnosis present

## 2020-07-24 DIAGNOSIS — Z6841 Body Mass Index (BMI) 40.0 and over, adult: Secondary | ICD-10-CM | POA: Diagnosis not present

## 2020-07-24 DIAGNOSIS — G9340 Encephalopathy, unspecified: Secondary | ICD-10-CM | POA: Diagnosis not present

## 2020-07-24 DIAGNOSIS — I469 Cardiac arrest, cause unspecified: Secondary | ICD-10-CM | POA: Diagnosis present

## 2020-07-24 DIAGNOSIS — N179 Acute kidney failure, unspecified: Secondary | ICD-10-CM | POA: Diagnosis present

## 2020-07-24 DIAGNOSIS — G931 Anoxic brain damage, not elsewhere classified: Secondary | ICD-10-CM

## 2020-07-24 DIAGNOSIS — I4901 Ventricular fibrillation: Principal | ICD-10-CM | POA: Diagnosis present

## 2020-07-24 DIAGNOSIS — Z01818 Encounter for other preprocedural examination: Secondary | ICD-10-CM

## 2020-07-24 DIAGNOSIS — G935 Compression of brain: Secondary | ICD-10-CM | POA: Diagnosis present

## 2020-07-24 LAB — CBC WITH DIFFERENTIAL/PLATELET
Abs Immature Granulocytes: 0.68 10*3/uL — ABNORMAL HIGH (ref 0.00–0.07)
Basophils Absolute: 0.1 10*3/uL (ref 0.0–0.1)
Basophils Relative: 1 %
Eosinophils Absolute: 0.1 10*3/uL (ref 0.0–0.5)
Eosinophils Relative: 1 %
HCT: 40.4 % (ref 36.0–46.0)
Hemoglobin: 12 g/dL (ref 12.0–15.0)
Immature Granulocytes: 11 %
Lymphocytes Relative: 69 %
Lymphs Abs: 4.3 10*3/uL — ABNORMAL HIGH (ref 0.7–4.0)
MCH: 26.8 pg (ref 26.0–34.0)
MCHC: 29.7 g/dL — ABNORMAL LOW (ref 30.0–36.0)
MCV: 90.2 fL (ref 80.0–100.0)
Monocytes Absolute: 0.3 10*3/uL (ref 0.1–1.0)
Monocytes Relative: 5 %
Neutro Abs: 0.8 10*3/uL — ABNORMAL LOW (ref 1.7–7.7)
Neutrophils Relative %: 13 %
Platelets: 212 10*3/uL (ref 150–400)
RBC: 4.48 MIL/uL (ref 3.87–5.11)
RDW: 13.3 % (ref 11.5–15.5)
WBC Morphology: INCREASED
WBC: 6.2 10*3/uL (ref 4.0–10.5)
nRBC: 0.5 % — ABNORMAL HIGH (ref 0.0–0.2)

## 2020-07-24 LAB — ETHANOL: Alcohol, Ethyl (B): <10 mg/dL (ref <10)

## 2020-07-24 LAB — BASIC METABOLIC PANEL
Anion gap: 9 (ref 5–15)
BUN: 11 mg/dL (ref 6–20)
CO2: 21 mmol/L — ABNORMAL LOW (ref 22–32)
Calcium: 7.7 mg/dL — ABNORMAL LOW (ref 8.9–10.3)
Chloride: 103 mmol/L (ref 98–111)
Creatinine, Ser: 1.08 mg/dL — ABNORMAL HIGH (ref 0.44–1.00)
GFR, Estimated: >60 mL/min (ref >60)
Glucose, Bld: 203 mg/dL — ABNORMAL HIGH (ref 70–99)
Potassium: 3.9 mmol/L (ref 3.5–5.1)
Sodium: 133 mmol/L — ABNORMAL LOW (ref 135–145)

## 2020-07-24 LAB — TROPONIN I (HIGH SENSITIVITY)
Troponin I (High Sensitivity): 66 ng/L — ABNORMAL HIGH (ref <18)
Troponin I (High Sensitivity): 2337 ng/L (ref <18)

## 2020-07-24 LAB — RAPID URINE DRUG SCREEN, HOSP PERFORMED
Amphetamines: NOT DETECTED
Barbiturates: NOT DETECTED
Benzodiazepines: NOT DETECTED
Cocaine: NOT DETECTED
Opiates: NOT DETECTED
Tetrahydrocannabinol: NOT DETECTED

## 2020-07-24 LAB — I-STAT CHEM 8, ED
BUN: 8 mg/dL (ref 6–20)
Calcium, Ion: 1.08 mmol/L — ABNORMAL LOW (ref 1.15–1.40)
Chloride: 102 mmol/L (ref 98–111)
Creatinine, Ser: 0.8 mg/dL (ref 0.44–1.00)
Glucose, Bld: 312 mg/dL — ABNORMAL HIGH (ref 70–99)
HCT: 39 % (ref 36.0–46.0)
Hemoglobin: 13.3 g/dL (ref 12.0–15.0)
Potassium: 3.5 mmol/L (ref 3.5–5.1)
Sodium: 138 mmol/L (ref 135–145)
TCO2: 21 mmol/L — ABNORMAL LOW (ref 22–32)

## 2020-07-24 LAB — I-STAT ARTERIAL BLOOD GAS, ED
Acid-base deficit: 5 mmol/L — ABNORMAL HIGH (ref 0.0–2.0)
Bicarbonate: 22.3 mmol/L (ref 20.0–28.0)
Calcium, Ion: 1.09 mmol/L — ABNORMAL LOW (ref 1.15–1.40)
HCT: 38 % (ref 36.0–46.0)
Hemoglobin: 12.9 g/dL (ref 12.0–15.0)
O2 Saturation: 97 %
Potassium: 3.9 mmol/L (ref 3.5–5.1)
Sodium: 136 mmol/L (ref 135–145)
TCO2: 24 mmol/L (ref 22–32)
pCO2 arterial: 51.6 mmHg — ABNORMAL HIGH (ref 32.0–48.0)
pH, Arterial: 7.244 — ABNORMAL LOW (ref 7.350–7.450)
pO2, Arterial: 105 mmHg (ref 83.0–108.0)

## 2020-07-24 LAB — COMPREHENSIVE METABOLIC PANEL
ALT: 129 U/L — ABNORMAL HIGH (ref 0–44)
AST: 122 U/L — ABNORMAL HIGH (ref 15–41)
Albumin: 3.1 g/dL — ABNORMAL LOW (ref 3.5–5.0)
Alkaline Phosphatase: 53 U/L (ref 38–126)
Anion gap: 16 — ABNORMAL HIGH (ref 5–15)
BUN: 8 mg/dL (ref 6–20)
CO2: 18 mmol/L — ABNORMAL LOW (ref 22–32)
Calcium: 8.2 mg/dL — ABNORMAL LOW (ref 8.9–10.3)
Chloride: 102 mmol/L (ref 98–111)
Creatinine, Ser: 1.12 mg/dL — ABNORMAL HIGH (ref 0.44–1.00)
GFR, Estimated: >60 mL/min (ref >60)
Glucose, Bld: 314 mg/dL — ABNORMAL HIGH (ref 70–99)
Potassium: 3.5 mmol/L (ref 3.5–5.1)
Sodium: 136 mmol/L (ref 135–145)
Total Bilirubin: 0.5 mg/dL (ref 0.3–1.2)
Total Protein: 6.2 g/dL — ABNORMAL LOW (ref 6.5–8.1)

## 2020-07-24 LAB — PROTIME-INR
INR: 1.2 (ref 0.8–1.2)
INR: 1.1 (ref 0.8–1.2)
Prothrombin Time: 15.1 seconds (ref 11.4–15.2)
Prothrombin Time: 14.3 seconds (ref 11.4–15.2)

## 2020-07-24 LAB — RESP PANEL BY RT-PCR (FLU A&B, COVID) ARPGX2
Influenza A by PCR: NEGATIVE
Influenza B by PCR: NEGATIVE
SARS Coronavirus 2 by RT PCR: NEGATIVE

## 2020-07-24 LAB — BRAIN NATRIURETIC PEPTIDE: B Natriuretic Peptide: 96.7 pg/mL (ref 0.0–100.0)

## 2020-07-24 LAB — GLUCOSE, CAPILLARY
Glucose-Capillary: 153 mg/dL — ABNORMAL HIGH (ref 70–99)
Glucose-Capillary: 229 mg/dL — ABNORMAL HIGH (ref 70–99)

## 2020-07-24 LAB — CBG MONITORING, ED: Glucose-Capillary: 276 mg/dL — ABNORMAL HIGH (ref 70–99)

## 2020-07-24 LAB — APTT
aPTT: 34 seconds (ref 24–36)
aPTT: 31 seconds (ref 24–36)

## 2020-07-24 LAB — HCG, SERUM, QUALITATIVE: Preg, Serum: NEGATIVE

## 2020-07-24 MED ORDER — SODIUM CHLORIDE 0.9 % IV BOLUS
1000.0000 mL | Freq: Once | INTRAVENOUS | Status: AC
  Administered 2020-07-24: 1000 mL via INTRAVENOUS

## 2020-07-24 MED ORDER — ASPIRIN 300 MG RE SUPP: 300.0000 mg | RECTAL | Status: DC

## 2020-07-24 MED ORDER — NOREPINEPHRINE 4 MG/250ML-% IV SOLN
0.0000 ug/min | INTRAVENOUS | Status: DC
  Administered 2020-07-25: 10 ug/min via INTRAVENOUS
  Filled 2020-07-24: qty 250

## 2020-07-24 MED ORDER — SODIUM CHLORIDE 0.9 % IV SOLN: INTRAVENOUS | Status: DC

## 2020-07-24 MED ORDER — IOHEXOL 350 MG/ML SOLN
100.0000 mL | Freq: Once | INTRAVENOUS | Status: AC | PRN
  Administered 2020-07-24: 100 mL via INTRAVENOUS

## 2020-07-24 MED ORDER — NOREPINEPHRINE 4 MG/250ML-% IV SOLN
2.0000 ug/min | INTRAVENOUS | Status: DC
  Administered 2020-07-24 (×2): 20 ug/min via INTRAVENOUS
  Filled 2020-07-24 (×3): qty 250

## 2020-07-24 MED ORDER — DEXTROSE 5 % IV SOLN
INTRAVENOUS | Status: AC | PRN
  Administered 2020-07-24: 150 mg via INTRAVENOUS

## 2020-07-24 MED ORDER — NOREPINEPHRINE BITARTRATE 1 MG/ML IV SOLN
INTRAVENOUS | Status: AC | PRN
  Administered 2020-07-24: 5 ug/kg/min via INTRAVENOUS

## 2020-07-24 MED ORDER — SODIUM CHLORIDE 0.9 % IV BOLUS
2000.0000 mL | Freq: Once | INTRAVENOUS | Status: AC
  Administered 2020-07-24: 2000 mL via INTRAVENOUS

## 2020-07-24 MED ORDER — SODIUM CHLORIDE 0.9 % IV SOLN: 250.0000 mL | INTRAVENOUS | Status: DC

## 2020-07-24 NOTE — ED Notes (Signed)
CCM at the bedside placing CVC and art line.
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
## 2020-07-24 NOTE — Code Documentation (Signed)
MD and RT at bedside preparing for intubation

## 2020-07-24 NOTE — Consult Note (Signed)
Neurology Consultation Reason for Consult: Anoxic brain injury Referring Physician: Elzie Rings  CC: Unresponsiveness  History is obtained from: Chart review  HPI: Madison Castillo is a 45 y.o. female who presented with a cardiac arrest.  On EMS arrival, she was asystole followed by VT, estimated downtime was 60 minutes.   In the emergency department, she has a dismal appearing exam and has become hypothermic.  Initially, CT was read as negative, however there is concern for anoxic injury on it and neurology has therefore been consulted.    ROS: Unable to obtain due to altered mental status.   Past Medical History:  Diagnosis Date  . Asthma   . Chlamydia   . No pertinent past medical history      Family History  Problem Relation Age of Onset  . Hypertension Mother   . Diabetes Mother   . Hypertension Maternal Grandmother   . Heart disease Maternal Grandmother   . Hypertension Maternal Grandfather   . Heart disease Maternal Grandfather   . Other Neg Hx      Social History:  reports that she has never smoked. She does not have any smokeless tobacco history on file. She reports current alcohol use. She reports that she does not use drugs.   Exam: Current vital signs: BP 93/69   Pulse 76   Temp (!) 94.2 F (34.6 C)   Resp 18   Ht 5\' 4"  (1.626 m)   SpO2 95%   BMI 56.15 kg/m  Vital signs in last 24 hours: Temp:  [92.7 F (33.7 C)-96.6 F (35.9 C)] 94.2 F (34.6 C) (04/17 1730) Pulse Rate:  [71-113] 76 (04/17 1730) Resp:  [13-21] 18 (04/17 1730) BP: (74-104)/(42-69) 93/69 (04/17 1730) SpO2:  [83 %-96 %] 95 % (04/17 1730) FiO2 (%):  [100 %] 100 % (04/17 1439)   Physical Exam  Constitutional: Appears well-developed and well-nourished.  Psych: Affect appropriate to situation Eyes: No scleral injection HENT: ET tube in place MSK: no joint deformities.  Cardiovascular: Normal rate and regular rhythm.  Respiratory: Effort normal, non-labored breathing GI: Soft.   No distension. There is no tenderness.  Skin: WDI  Mental Status: Patient does not respond to verbal stimuli.  Does not respond to deep sternal rub.  Does not follow commands.  No verbalizations are noted.   Cranial Nerves: II: patient does not respond confrontation bilaterally, pupils right 6 mm, left 7 mm, fixed on the left, minimal sluggish reactivity on the right III,IV,VI: Doll's eye responses absent bilaterally. V,VII: corneal reflex absent bilaterally  VIII: patient does not respond to verbal stimuli IX,X: Cough reflex absent,  XI: unable to test bilaterally due to coma XII: unable to test due to coma  Motor: Extremities flaccid throughout.  No spontaneous movement noted.  No purposeful movements noted.  Sensory: Does not respond to noxious stimuli in any extremity.  Deep Tendon Reflexes:  Absent throughout.  Plantars: equivocal bilaterally  Cerebellar: Unable to perform due to coma  Gait: Unable to perform due to coma   I have reviewed labs in epic and the results pertinent to this consultation are: Glucose 314 Creatinine 1.1 Mild elevations in the LFTs  I have reviewed the images obtained: CT head-diffuse cerebral edema with loss of sulci, loss of prepontine space consistent with herniation  Impression: 45 year old female with devastating anoxic injury.  I do not expect her to survive this.  With her exam already being so close to brain death, I suspect she will progress to  this fairly quickly.  I discussed with the family that I was concerned that she had had a fairly severe anoxic brain injury.  Would repeat CT in the morning, and if her exam is consistent with it consider declaration of brain death.  Recommendations: 1) repeat CT in the morning 2) if exam is consistent, consider declaration of brain death.   This patient is critically ill and at significant risk of neurological worsening, death and care requires constant monitoring of vital signs,  hemodynamics,respiratory and cardiac monitoring, neurological assessment, discussion with family, other specialists and medical decision making of high complexity. I spent 35 minutes of neurocritical care time  in the care of  this patient. This was time spent independent of any time provided by nurse practitioner or PA.  Roland Rack, MD [facility] [phone]  If 7pm- 7am, please page neurology on call as listed in [place]. 07/23/2020  6:18 PM

## 2020-07-24 NOTE — Progress Notes (Signed)
eLink Physician-Brief Progress Note Patient Name: Madison Castillo DOB: 1975/06/02 MRN: [id number]   Date of Service  08/04/2020  HPI/Events of Note  Patient admitted in a coma, with presumed severe anoxic brain injury s/p out of hospital cardiac arrest with prolonged resuscitation time. Prognosis is very poor per neurology assessment. Patient is intubated and mechanically ventilated.  eICU Interventions  New Patient Evaluation completed.        Kerry Kass Jeancarlos Marchena 08/01/2020, 8:10 PM

## 2020-07-24 NOTE — Progress Notes (Signed)
eLink Physician-Brief Progress Note Patient Name: Madison Castillo DOB: Jun 26, 1975 MRN: [id number]   Date of Service  07/20/2020  HPI/Events of Note  Bedside needs Levo order changed from peripheral to regular as patient now has a central line. Patient has a moderate acidosis with a respiratory component.  eICU Interventions  Full dose Levophed ordered. Respiratory rate on the ventilator increased from 22 to 24.        Frederik Pear 08/04/2020, 9:36 PM
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
## 2020-07-24 NOTE — Procedures (Signed)
Central Venous Catheter Insertion Procedure Note Madison Castillo [id number] [birth year]  Procedure: Insertion of Central Venous Catheter Indications: Assessment of intravascular volume, Drug and/or fluid administration and Frequent blood sampling  Procedure Details Consent: Risks of procedure as well as the alternatives and risks of each were explained to the (patient/caregiver).  Consent for procedure obtained. Time Out: Verified patient identification, verified procedure, site/side was marked, verified correct patient position, special equipment/implants available, medications/allergies/relevent history reviewed, required imaging and test results available.  Performed  Maximum sterile technique was used including antiseptics, cap, gloves, gown, hand hygiene, mask and sheet. Skin prep: Chlorhexidine; local anesthetic administered A antimicrobial bonded/coated triple lumen catheter was placed in the left internal jugular vein using the Seldinger technique. Ultrasound guidance used.Yes.   Catheter placed to 20 cm. Blood aspirated via all 3 ports and then flushed x 3. Line sutured x 2 and dressing applied.  Evaluation Blood flow good Complications: No apparent complications Patient did tolerate procedure well. Chest X-ray ordered to verify placement.  CXR: pending.  Madison Castillo ACNP Acute Care Nurse Practitioner [place] Please consult Amion 08/06/2020, 5:19 PM

## 2020-07-24 NOTE — Progress Notes (Signed)
Received lab report for critical troponin of 2,337. MD aware.
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
## 2020-07-25 ENCOUNTER — Inpatient Hospital Stay (HOSPITAL_COMMUNITY): Payer: Medicaid Other

## 2020-07-25 DIAGNOSIS — I469 Cardiac arrest, cause unspecified: Secondary | ICD-10-CM | POA: Diagnosis not present

## 2020-07-25 LAB — CBC
HCT: 36.3 % (ref 36.0–46.0)
Hemoglobin: 11.8 g/dL — ABNORMAL LOW (ref 12.0–15.0)
MCH: 26.8 pg (ref 26.0–34.0)
MCHC: 32.5 g/dL (ref 30.0–36.0)
MCV: 82.5 fL (ref 80.0–100.0)
Platelets: 206 10*3/uL (ref 150–400)
RBC: 4.4 MIL/uL (ref 3.87–5.11)
RDW: 13.3 % (ref 11.5–15.5)
WBC: 10.2 10*3/uL (ref 4.0–10.5)
nRBC: 0 % (ref 0.0–0.2)

## 2020-07-25 LAB — POCT I-STAT 7, (LYTES, BLD GAS, ICA,H+H)
Acid-base deficit: 4 mmol/L — ABNORMAL HIGH (ref 0.0–2.0)
Acid-base deficit: 5 mmol/L — ABNORMAL HIGH (ref 0.0–2.0)
Acid-base deficit: 5 mmol/L — ABNORMAL HIGH (ref 0.0–2.0)
Acid-base deficit: 6 mmol/L — ABNORMAL HIGH (ref 0.0–2.0)
Bicarbonate: 20.4 mmol/L (ref 20.0–28.0)
Bicarbonate: 19.3 mmol/L — ABNORMAL LOW (ref 20.0–28.0)
Bicarbonate: 20.7 mmol/L (ref 20.0–28.0)
Bicarbonate: 22.1 mmol/L (ref 20.0–28.0)
Calcium, Ion: 1.06 mmol/L — ABNORMAL LOW (ref 1.15–1.40)
Calcium, Ion: 1.07 mmol/L — ABNORMAL LOW (ref 1.15–1.40)
Calcium, Ion: 1.1 mmol/L — ABNORMAL LOW (ref 1.15–1.40)
Calcium, Ion: 1.14 mmol/L — ABNORMAL LOW (ref 1.15–1.40)
HCT: 37 % (ref 36.0–46.0)
HCT: 35 % — ABNORMAL LOW (ref 36.0–46.0)
HCT: 34 % — ABNORMAL LOW (ref 36.0–46.0)
HCT: 35 % — ABNORMAL LOW (ref 36.0–46.0)
Hemoglobin: 12.6 g/dL (ref 12.0–15.0)
Hemoglobin: 11.9 g/dL — ABNORMAL LOW (ref 12.0–15.0)
Hemoglobin: 11.6 g/dL — ABNORMAL LOW (ref 12.0–15.0)
Hemoglobin: 11.9 g/dL — ABNORMAL LOW (ref 12.0–15.0)
O2 Saturation: 100 %
O2 Saturation: 96 %
O2 Saturation: 90 %
O2 Saturation: 96 %
Patient temperature: 36.3
Patient temperature: 35.7
Patient temperature: 35.8
Patient temperature: 35.9
Potassium: 3.9 mmol/L (ref 3.5–5.1)
Potassium: 4 mmol/L (ref 3.5–5.1)
Potassium: 3.9 mmol/L (ref 3.5–5.1)
Potassium: 3.9 mmol/L (ref 3.5–5.1)
Sodium: 136 mmol/L (ref 135–145)
Sodium: 136 mmol/L (ref 135–145)
Sodium: 137 mmol/L (ref 135–145)
Sodium: 138 mmol/L (ref 135–145)
TCO2: 21 mmol/L — ABNORMAL LOW (ref 22–32)
TCO2: 20 mmol/L — ABNORMAL LOW (ref 22–32)
TCO2: 22 mmol/L (ref 22–32)
TCO2: 24 mmol/L (ref 22–32)
pCO2 arterial: 32.5 mmHg (ref 32.0–48.0)
pCO2 arterial: 30.2 mmHg — ABNORMAL LOW (ref 32.0–48.0)
pCO2 arterial: 37.9 mmHg (ref 32.0–48.0)
pCO2 arterial: 52.5 mmHg — ABNORMAL HIGH (ref 32.0–48.0)
pH, Arterial: 7.404 (ref 7.350–7.450)
pH, Arterial: 7.407 (ref 7.350–7.450)
pH, Arterial: 7.226 — ABNORMAL LOW (ref 7.350–7.450)
pH, Arterial: 7.34 — ABNORMAL LOW (ref 7.350–7.450)
pO2, Arterial: 271 mmHg — ABNORMAL HIGH (ref 83.0–108.0)
pO2, Arterial: 74 mmHg — ABNORMAL LOW (ref 83.0–108.0)
pO2, Arterial: 66 mmHg — ABNORMAL LOW (ref 83.0–108.0)
pO2, Arterial: 81 mmHg — ABNORMAL LOW (ref 83.0–108.0)

## 2020-07-25 LAB — MAGNESIUM: Magnesium: 1.8 mg/dL (ref 1.7–2.4)

## 2020-07-25 LAB — BASIC METABOLIC PANEL
Anion gap: 7 (ref 5–15)
BUN: 15 mg/dL (ref 6–20)
CO2: 20 mmol/L — ABNORMAL LOW (ref 22–32)
Calcium: 7.7 mg/dL — ABNORMAL LOW (ref 8.9–10.3)
Chloride: 106 mmol/L (ref 98–111)
Creatinine, Ser: 1.95 mg/dL — ABNORMAL HIGH (ref 0.44–1.00)
GFR, Estimated: 32 mL/min — ABNORMAL LOW (ref >60)
Glucose, Bld: 127 mg/dL — ABNORMAL HIGH (ref 70–99)
Potassium: 3.7 mmol/L (ref 3.5–5.1)
Sodium: 133 mmol/L — ABNORMAL LOW (ref 135–145)

## 2020-07-25 LAB — GLUCOSE, CAPILLARY
Glucose-Capillary: 130 mg/dL — ABNORMAL HIGH (ref 70–99)
Glucose-Capillary: 118 mg/dL — ABNORMAL HIGH (ref 70–99)
Glucose-Capillary: 115 mg/dL — ABNORMAL HIGH (ref 70–99)

## 2020-07-25 LAB — PATHOLOGIST SMEAR REVIEW: Path Review: REACTIVE

## 2020-07-25 MED ORDER — INSULIN ASPART 100 UNIT/ML ~~LOC~~ SOLN: 0.0000 [IU] | SUBCUTANEOUS | Status: DC

## 2020-07-25 MED ORDER — CHLORHEXIDINE GLUCONATE CLOTH 2 % EX PADS: 6.0000 | MEDICATED_PAD | Freq: Every day | CUTANEOUS | Status: DC

## 2020-07-25 MED ORDER — POTASSIUM CHLORIDE 20 MEQ PO PACK: 20.0000 meq | PACK | Freq: Once | ORAL | Status: DC

## 2020-07-25 MED ORDER — PANTOPRAZOLE SODIUM 40 MG IV SOLR: 40.0000 mg | Freq: Every day | INTRAVENOUS | Status: DC

## 2020-07-25 MED ORDER — SODIUM CHLORIDE 0.9 % IV SOLN
2.0000 g | INTRAVENOUS | Status: DC
  Filled 2020-07-25: qty 20

## 2020-07-25 MED ORDER — MAGNESIUM SULFATE 2 GM/50ML IV SOLN: 2.0000 g | Freq: Once | INTRAVENOUS | Status: DC

## 2020-07-25 MED FILL — Norepinephrine-Dextrose IV Solution 4 MG/250ML-5%: INTRAVENOUS | Qty: 250 | Status: AC
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
## 2020-07-25 NOTE — Plan of Care (Signed)

## 2020-07-25 NOTE — Procedures (Signed)
Adult Brain Death Determination  Time of Examination: 2020-07-28 12:12 PM  A. No Evidence of /Cause of Reversible CNS Depression  Core temperature must be greater >36 degrees. Last temp: 36C (Note: If unable to achieve normothermia after 12 hours of temperature management, may consider proceeding with Brain Death Evaluation.):    yes  1. Evidence of severe metabolic perturbations that could potentate CNS depression. Consider glucose, Na, creatinine, PaCO2, SaO2.:    Absent  2. Evidence of drugs, by history or measurement, that could potentiate central nervous system depression: narcotics, ethanol, benzodiazepines, barbiturates, neuromuscular blockade.:     Absent  B. Absence of Cortical Function  1. GCS = 3:    yes  C. Absence of Brain Stem Reflexes and Responses  1. Pupils light-fixed    yes  2. Corneal reflexes:    Absent  3. Response to upper and lower airway stimulation, such as pharyngeal and endotracheal suctioning.:    Absent  4. Ocular response to head turning (eye movement).    Absent  D. Absence of Spontaneous Respirations  (Apnea test performed per Brain Death Policy. If not met due to hemodynamic/ventilatory instability, then perform EEG, TCD, or cerebral blow flow studies.)  1.   Spontaneous Respirations   Absent  2.   PaCO2 at start of apnea test:  32  3.   PaCO2 at end of apnea test:  55  4.   CO2 rise of 20 or greater from baseline:   yes  E. Document Confirmatory Test Utilized: (Optional) Nuclear cerebral flow, cerebral angiography (CT/MR angio), transcranial Doppler ultrasound, EEG, SSEP (record results).  1. Test results (if available):  CT Head showing herniation 07-28-20  Patient pronounced dead by neurological criteria at 12:00 PM on 07-28-20.  Madison Furbish, MD 2020-07-28 12:12 PM

## 2020-07-25 NOTE — Procedures (Signed)
Pilot balloon broke off Patient desaturated, unable to get volumes Bougee advanced down ETT and new one exchanged over bougee No sedation given CXR pending  Erskine Emery MD PCCM

## 2020-07-25 NOTE — Care Plan (Signed)
Discussed with Dr. Ina Homes.  Personally reviewed head CT, which demonstrates worsening diffuse anoxic injury.  Per Dr. Tamala Julian patient's examination has additionally worsened with loss of pupillary responses though she is still overbreathing the ventilator at this time.  Agree with Dr. Tamala Julian that the patient is likely to progress to brain death, and he feels comfortable performing brain death testing.  Neurology will be available on an as-needed basis going forward, please page [place] if our services would be helpful.  Lesleigh Noe MD-PhD [facility] [phone] Available 7 AM to 7 PM, outside these hours please contact Neurologist on call listed on AMION

## 2020-07-25 NOTE — Progress Notes (Signed)
RT note-Called to room for broken pilot balloon/Dr. Tamala Julian called and at bedside, patient taken off ventilator and ETT exchanged with tube exchanger with 7.5 and re secured at 22 lip, xray pending, BBS equal with positive end tidal co2.

## 2020-07-25 NOTE — Progress Notes (Addendum)
No longer triggering vent. Will proceed with brain death testing.  Erskine Emery MD PCCM  Addendum: brain death confirmed at 12:00PM.  Family informed of patient's passing.  Additional 50 minutes critical care time coordinating brain death testing, discussing with family and support staff.

## 2020-07-25 NOTE — Progress Notes (Signed)
RT performed apnea test on patient. C02 elevated greater than 20. Patient declared brain dead at 1204-07-18. RT placed patient back on vent with full support settings. RT will continue to monitor.

## 2020-07-25 NOTE — Progress Notes (Signed)
Discussed with Dr. Tamala Julian. Cardiology will sign off given poor neurologic prognosis.  Elouise Munroe, MD 8:36 AM 07/22/2020

## 2020-07-25 NOTE — Progress Notes (Signed)
Chaplain responded to call from nurse that patient was being placed on comfort care and family was present. Patient's mother, father, sister and Madison Gentile' were bedside and moved to consult room. Doctor and nurse came into consult room to inform family that patient had passed. This was a shock to family and they were very emotional, especially the mother. Patient has seven children and they are coming to say goodbye to their mother. [facility] is waiting to speak with the three eldest of the patient's children. Chaplain is on standby when children have arrived. Chaplain gave patient placement card to patient's sister.Chaplain provided comfort, spiritual support, listening and prayer to family members.    07/20/2020 1130  Clinical Encounter Type  Visited With Patient and family together  Visit Type Death  Referral From Nurse  Consult/Referral To Chaplain  Spiritual Encounters  Spiritual Needs Emotional;Grief support;Prayer  Stress Factors  Family Stress Factors Loss

## 2020-07-25 NOTE — Progress Notes (Signed)
Patient deceased. Family informed. They want to try to say goodbye. Told them we would have to take body off ventilator in AM. DNR, no escalation.  Erskine Emery MD PCCM

## 2020-07-26 DIAGNOSIS — Z515 Encounter for palliative care: Secondary | ICD-10-CM

## 2020-07-26 LAB — GLUCOSE, CAPILLARY: Glucose-Capillary: 104 mg/dL — ABNORMAL HIGH (ref 70–99)

## 2020-07-26 MED ORDER — ACETAMINOPHEN 325 MG PO TABS: 650.0000 mg | ORAL_TABLET | Freq: Four times a day (QID) | ORAL | Status: DC | PRN

## 2020-07-26 MED ORDER — GLYCOPYRROLATE 0.2 MG/ML IJ SOLN
0.4000 mg | INTRAMUSCULAR | Status: DC | PRN
  Administered 2020-07-26: 0.4 mg via INTRAVENOUS
  Filled 2020-07-26: qty 2

## 2020-07-26 MED ORDER — MORPHINE SULFATE (PF) 2 MG/ML IV SOLN
2.0000 mg | INTRAVENOUS | Status: DC | PRN
Start: 2020-07-26 — End: 2020-07-26
  Administered 2020-07-26: 2 mg via INTRAVENOUS
  Filled 2020-07-26: qty 1

## 2020-07-26 MED ORDER — POLYVINYL ALCOHOL 1.4 % OP SOLN
1.0000 [drp] | Freq: Four times a day (QID) | OPHTHALMIC | Status: DC | PRN
  Filled 2020-07-26: qty 15

## 2020-07-26 MED ORDER — ACETAMINOPHEN 650 MG RE SUPP: 650.0000 mg | Freq: Four times a day (QID) | RECTAL | Status: DC | PRN

## 2020-07-26 MED ORDER — DIPHENHYDRAMINE HCL 50 MG/ML IJ SOLN: 25.0000 mg | INTRAMUSCULAR | Status: DC | PRN

## 2020-08-07 NOTE — Consult Note (Signed)
Consultation Note Date: 08-14-20   Patient Name: Madison Castillo  DOB: 05-25-1975  MRN: [id number]  Age / Sex: 45 y.o., female  PCP: [facility] Referring Physician: Candee Furbish, MD  Reason for Consultation: Establishing goals of care  HPI/Patient Profile: 45 y.o. female  with no significant past medical history but strong family history of coronary artery disease admitted on 07/28/2020 with cardiac arrest progressed to brain death.   Clinical Assessment and Goals of Care: I met today at Madison Castillo's bedside with multiple family members at different times including mother, sister, fiance, and some of her children. Family are struggling with complicated grief. They are having a very difficult time but do understand plan for extubation to comfort. Mother had to leave the hospital as she is not prepared to handle this now. I confirmed with sister that mother does not plan to return and plans to proceed with extubation. I did speak with sister and encouraged her to take time to allow herself time to process and grieve as she is currently spending all her energy on taking care of her family. I provided her with brochure for hospice bereavement services and encouraged her to reach out for herself and the rest of her family.   All questions/concerns addressed. I attempted to provide emotional support but family are not really at a place to want to talk to process.   Primary Decision Maker NEXT OF KIN mother with siblings and children    SUMMARY OF RECOMMENDATIONS   - Plans to extubate to comfort.   Code Status/Advance Care Planning:  DNR   Symptom Management:   Added medication to ensure comfort and no respiratory distress.   Prognosis:   Declared brain dead.   Discharge Planning: Hospital death.      Primary Diagnoses: Present on Admission: . Cardiac arrest ([place])   I have reviewed  the medical record, interviewed the patient and family, and examined the patient. The following aspects are pertinent.  Past Medical History:  Diagnosis Date  . Asthma   . Chlamydia   . No pertinent past medical history    Social History   Socioeconomic History  . Marital status: Single    Spouse name: Not on file  . Number of children: Not on file  . Years of education: Not on file  . Highest education level: Not on file  Occupational History  . Not on file  Tobacco Use  . Smoking status: Never Smoker  . Smokeless tobacco: Not on file  Substance and Sexual Activity  . Alcohol use: Yes    Comment: occassional at family functions  . Drug use: No  . Sexual activity: Yes    Birth control/protection: None, Surgical  Other Topics Concern  . Not on file  Social History Narrative   ** Merged History Encounter **       Social Determinants of Health   Financial Resource Strain: Not on file  Food Insecurity: Not on file  Transportation Needs: Not on file  Physical Activity:  Not on file  Stress: Not on file  Social Connections: Not on file   Family History  Problem Relation Age of Onset  . Hypertension Mother   . Diabetes Mother   . Hypertension Maternal Grandmother   . Heart disease Maternal Grandmother   . Hypertension Maternal Grandfather   . Heart disease Maternal Grandfather   . Other Neg Hx    Scheduled Meds: Continuous Infusions: PRN Meds:. No Known Allergies Review of Systems  Unable to perform ROS: Intubated    Physical Exam  Brain dead. No reflexes present.   Vital Signs: BP (!) 65/36   Pulse 62   Temp 97.9 F (36.6 C) (Oral)   Resp (!) 24   Ht 5' 4"  (1.626 m)   SpO2 92%   BMI 56.15 kg/m  Pain Scale: CPOT       SpO2: SpO2: 92 % O2 Device:SpO2: 92 % O2 Flow Rate: .   IO: Intake/output summary:   Intake/Output Summary (Last 24 hours) at 08-13-20 0957 Last data filed at Aug 13, 2020 0400 Gross per 24 hour  Intake 712.66 ml  Output 255  ml  Net 457.66 ml    LBM: Last BM Date:  (pta) Baseline Weight:   Most recent weight:       Palliative Assessment/Data:      Time Total: 30 min Greater than 50%  of this time was spent counseling and coordinating care related to the above assessment and plan.  Signed by: Vinie Sill, NP Palliative Medicine Team Pager # [phone] (M-F 8a-5p) Team Phone # [phone] (Nights/Weekends)

## 2020-08-07 NOTE — Progress Notes (Signed)
Patient cardiac time of death at 60. Pronounced by Eloise Harman RN and Alfonse Ras RN. Attending physician Erskine Emery MD notified. Family at bedside and notified of TOD.

## 2020-08-07 NOTE — Progress Notes (Signed)
This chaplain responded to unit secretary referral for spiritual care presence as the family navigates withdrawal of care.  The chaplain arrived on the unit and was informed the family declined spiritual care.  This chaplain is available F/U spiritual care as needed.
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
## 2020-08-07 NOTE — Progress Notes (Signed)
RT note- orders received for comfort transition, patient extubated, RN and family at bedside.

## 2020-08-07 NOTE — Plan of Care (Signed)
Pt with change in vital signs.  Attempt to call pt's mother and spouse, no answer at this time.  Voicemail w/ unit number left to spouse's phone.  Pt remains DNR.

## 2020-08-07 DEATH — deceased

## 2022-04-30 IMAGING — CT CT HEAD W/O CM
4 series · 16 of 47 positions shown, 18 images · non-contrast
Comparison: None.
COMPARISON: None.

Addendum:
CLINICAL DATA: Status post CPR

EXAM:
CT HEAD WITHOUT CONTRAST
TECHNIQUE: Contiguous axial images were obtained from the base of the skull
through the vertex without intravenous contrast.

[Series 3: head wo · axial · 0.44mm/px · z∈[+902,+1022]mm · 7 of 32 slices shown, 9 images]
[im 4/32  brain]
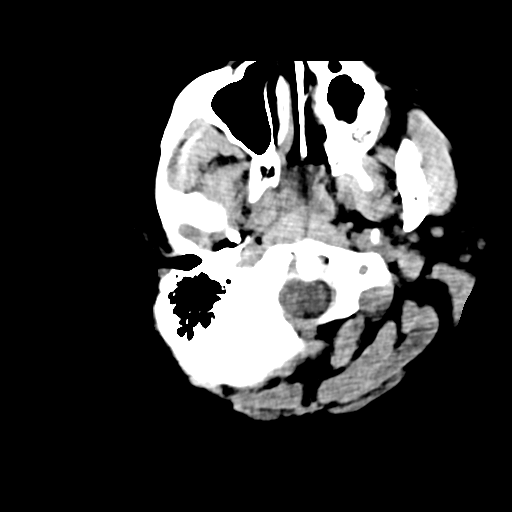
[im 4/32  bone]
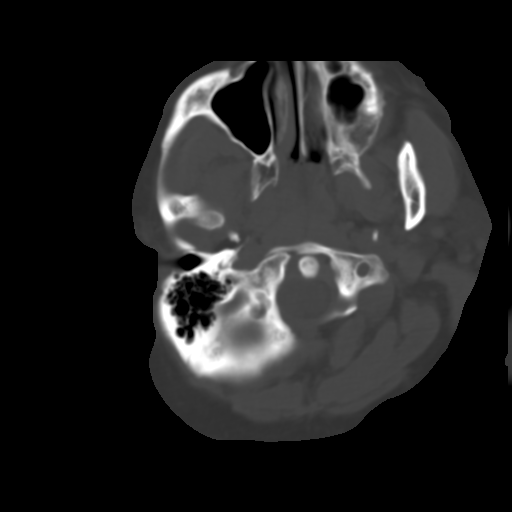
[im 8/32  brain]
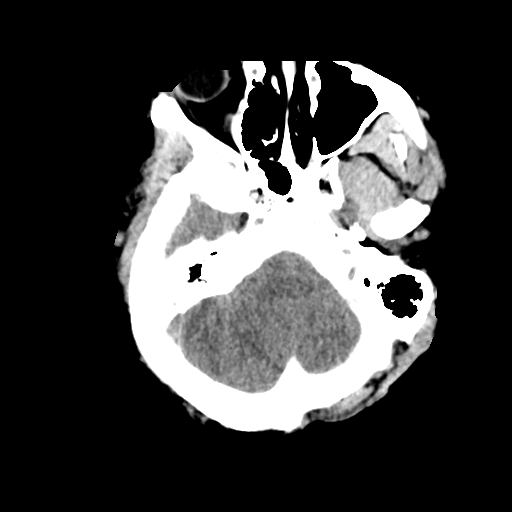
[im 12/32  brain]
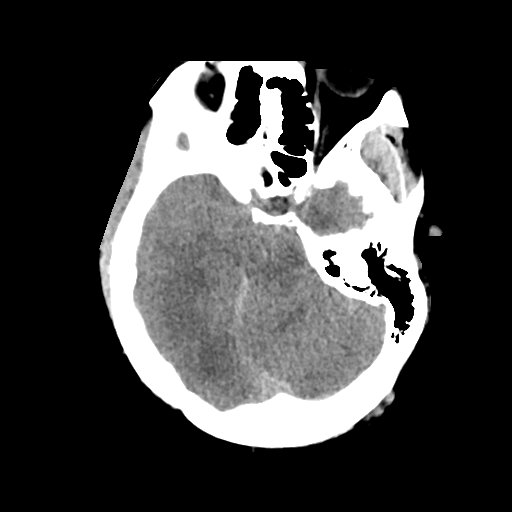
[im 16/32  brain]
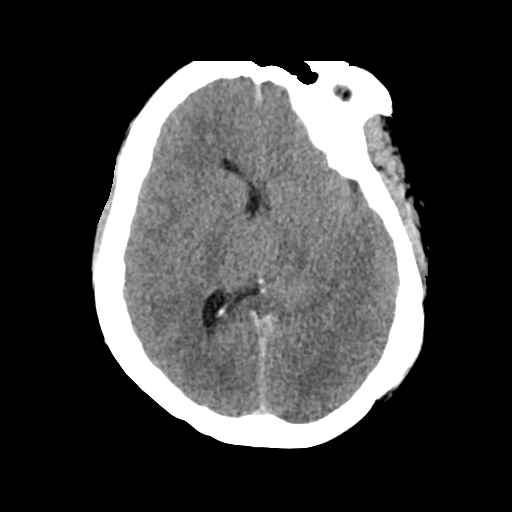
[im 20/32  brain]
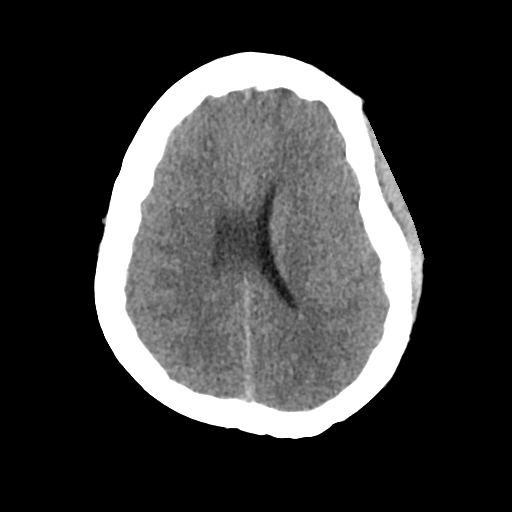
[im 20/32  bone]
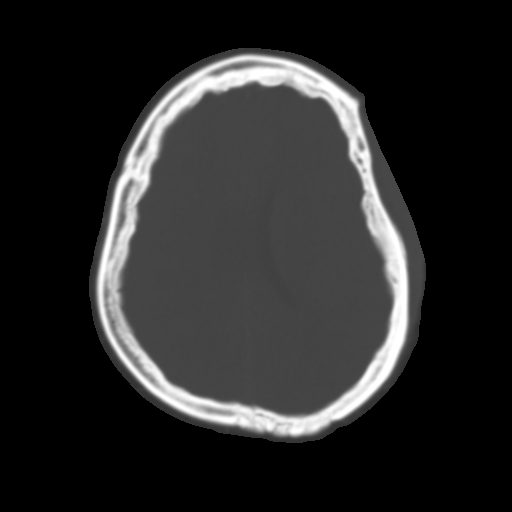
[im 24/32  brain]
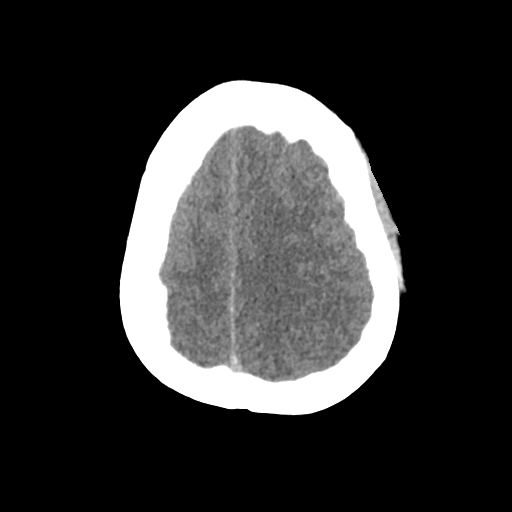
[im 28/32  brain]
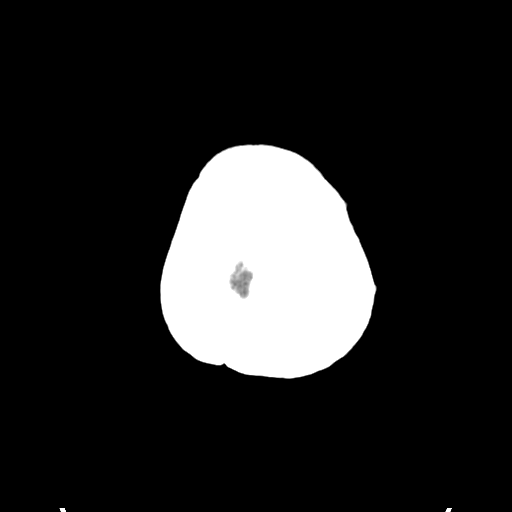

[Series 4: head bone · axial · 0.44mm/px · z∈[+902,+934]mm · 3 of 79 slices shown]
[im 8/79  bone]
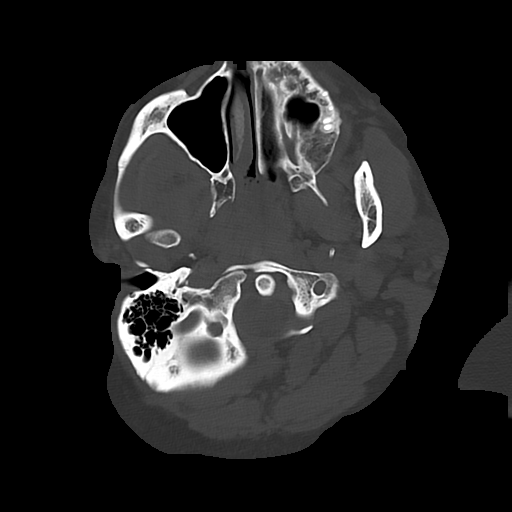
[im 16/79  bone]
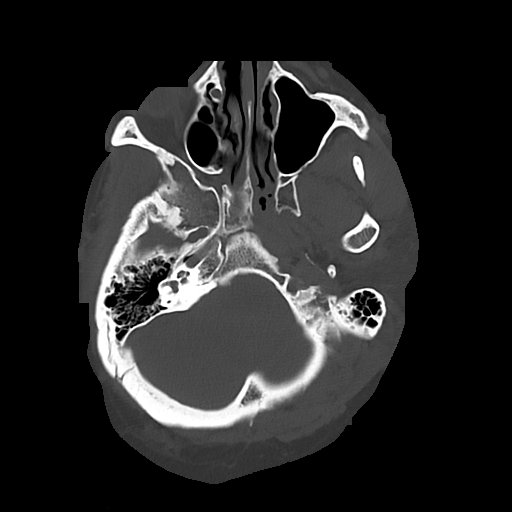
[im 24/79  bone]
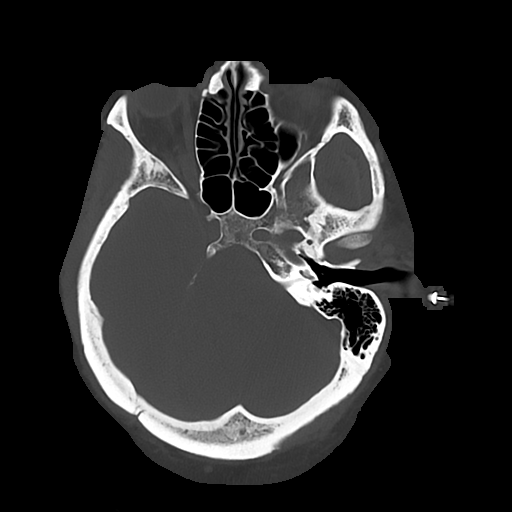

[Series 5: cor soft · coronal · 0.35mm/px · 3 of 74 slices shown]
[im 25/74  brain]
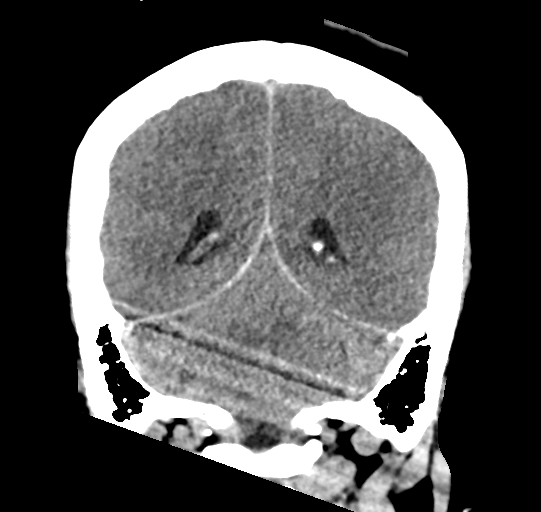
[im 33/74  brain]
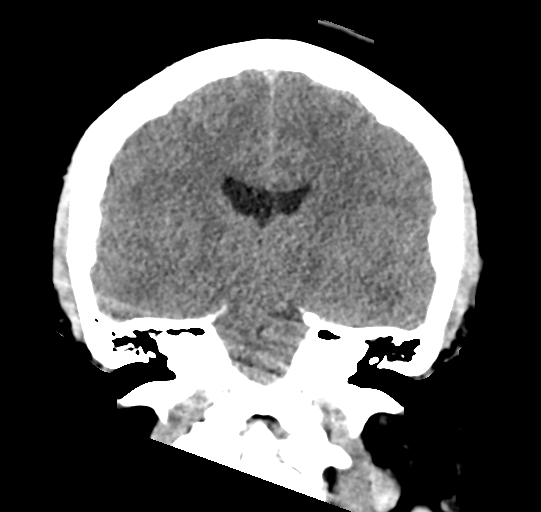
[im 41/74  brain]
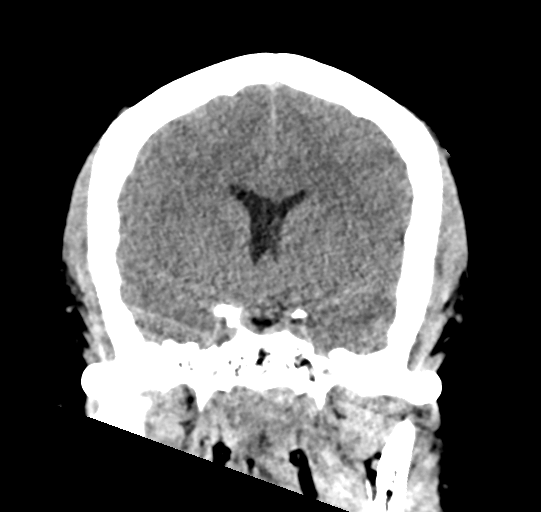

[Series 6: sag soft · sagittal · 0.35mm/px · 3 of 62 slices shown]
[im 27/62  brain]
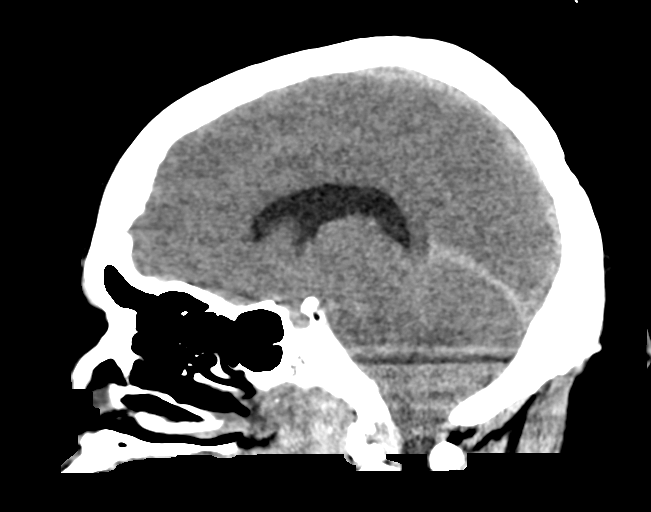
[im 31/62  brain]
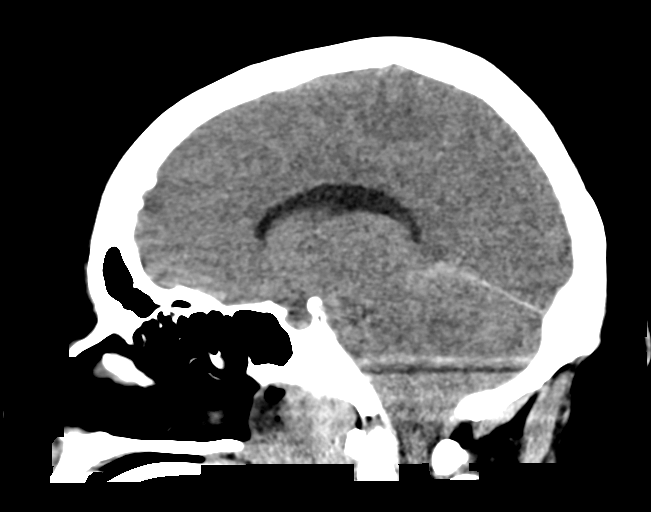
[im 35/62  brain]
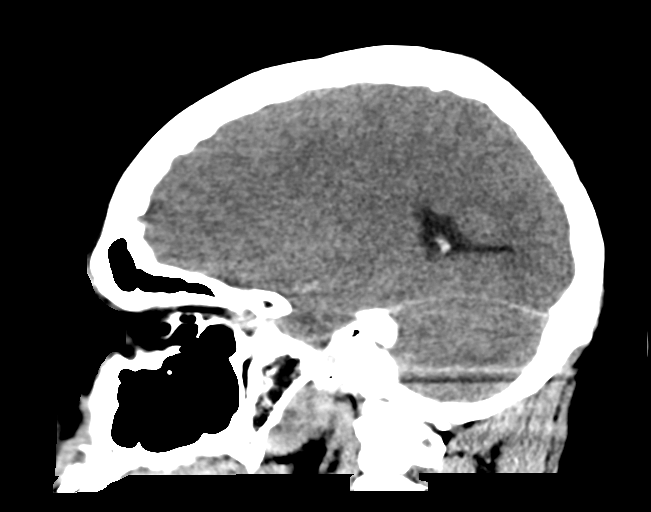

[16 of 47 positions shown; findings below may reference images not displayed]

FINDINGS: Brain: Patient is significantly tilted in the gantry. No evidence of
acute infarction, hemorrhage, hydrocephalus, extra-axial collection
or mass lesion/mass effect.

Vascular: No hyperdense vessel or unexpected calcification.

Skull: Normal. Negative for fracture or focal lesion.

Sinuses/Orbits: No acute finding.

Other: None.
IMPRESSION: No acute intracranial abnormality is noted.

ADDENDUM:
Further information from the referring clinician's has become
available that the patient's physical exam shows profound decreased
activity.

There is some loss of the CSF spaces surrounding the pons and
brainstem although the gray-white matter differentiation is
discrete. Additionally the tentorium and falx cerebrum are
increased in density. These changes may represent some mild diffuse
cerebral edema. The need for further follow-up can be determined on
a clinical basis.

*** End of Addendum ***
FINDINGS: Brain: Patient is significantly tilted in the gantry. No evidence of
acute infarction, hemorrhage, hydrocephalus, extra-axial collection
or mass lesion/mass effect.

Vascular: No hyperdense vessel or unexpected calcification.

Skull: Normal. Negative for fracture or focal lesion.

Sinuses/Orbits: No acute finding.

Other: None.
IMPRESSION: No acute intracranial abnormality is noted.

## 2022-04-30 IMAGING — DX DG CHEST 1V PORT
1 series · 1 of 1 positions shown · non-contrast
Comparison: None.

CLINICAL DATA: Post CPR

EXAM:
PORTABLE CHEST 1 VIEW

[chest ap]
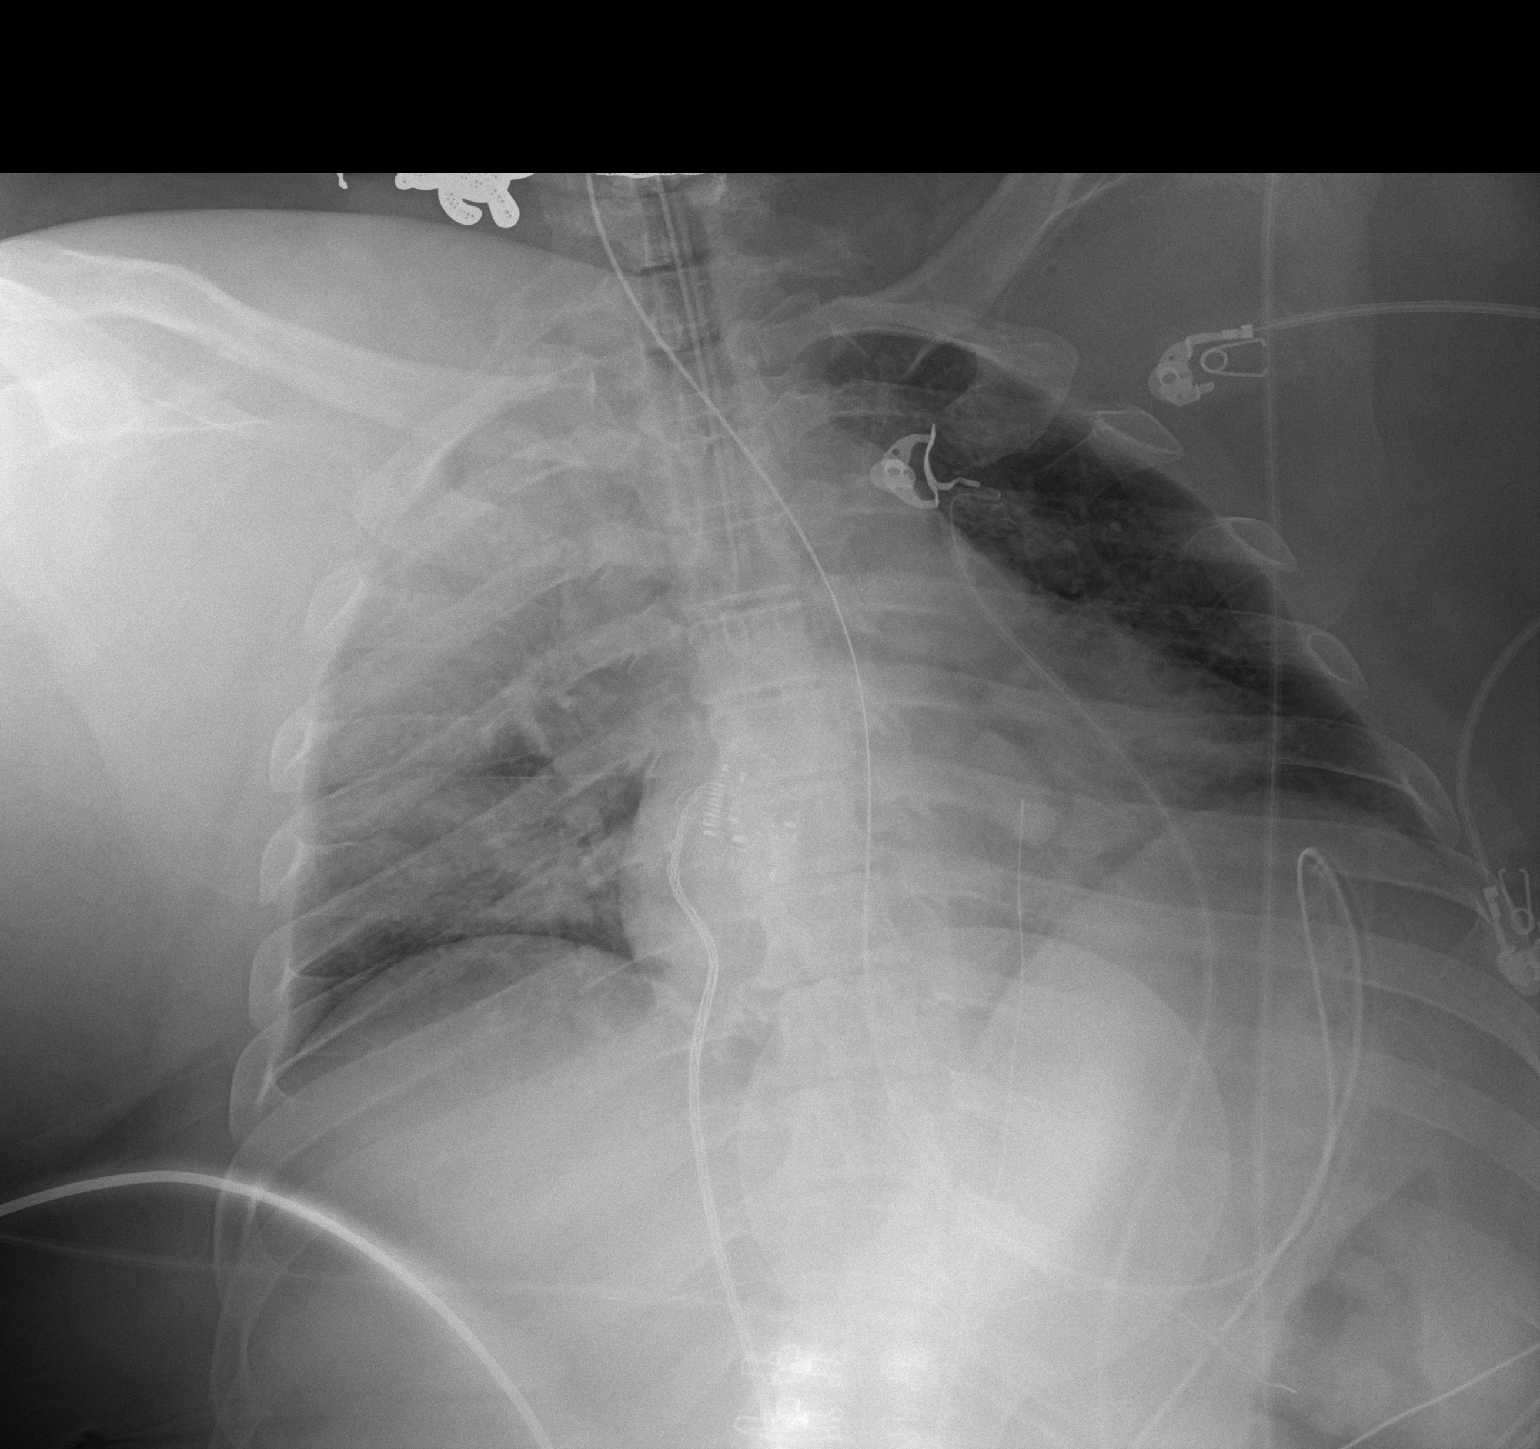

[1 of 1 positions shown; findings below may reference images not displayed]

FINDINGS: Endotracheal tube in place with tip at the level of the carina.
Enteric tube passes below the diaphragm.

Confluent airspace opacities throughout the majority of the RIGHT
lung, atelectasis versus asymmetric pulmonary edema. LEFT lung is
relatively clear. No pleural effusion or pneumothorax is seen.
IMPRESSION: 1. Endotracheal tube in place with tip at the level of the carina,
possibly projecting slightly into the RIGHT mainstem bronchus.
Recommend retracting 1-2 cm.
2. Confluent airspace opacities throughout the majority of the RIGHT
lung. This could represent atelectasis, impending airspace collapse
and/or asymmetric pulmonary edema.

These results were called by telephone at the time of interpretation
on 07/24/2020 at [DATE] to provider [facility] , who verbally
acknowledged these results.

## 2022-05-01 IMAGING — DX DG CHEST 1V PORT
1 series · 1 of 1 positions shown · non-contrast
Comparison: 07/24/2020

CLINICAL DATA: Endotracheal tube, asthma

EXAM:
PORTABLE CHEST 1 VIEW

[chest]
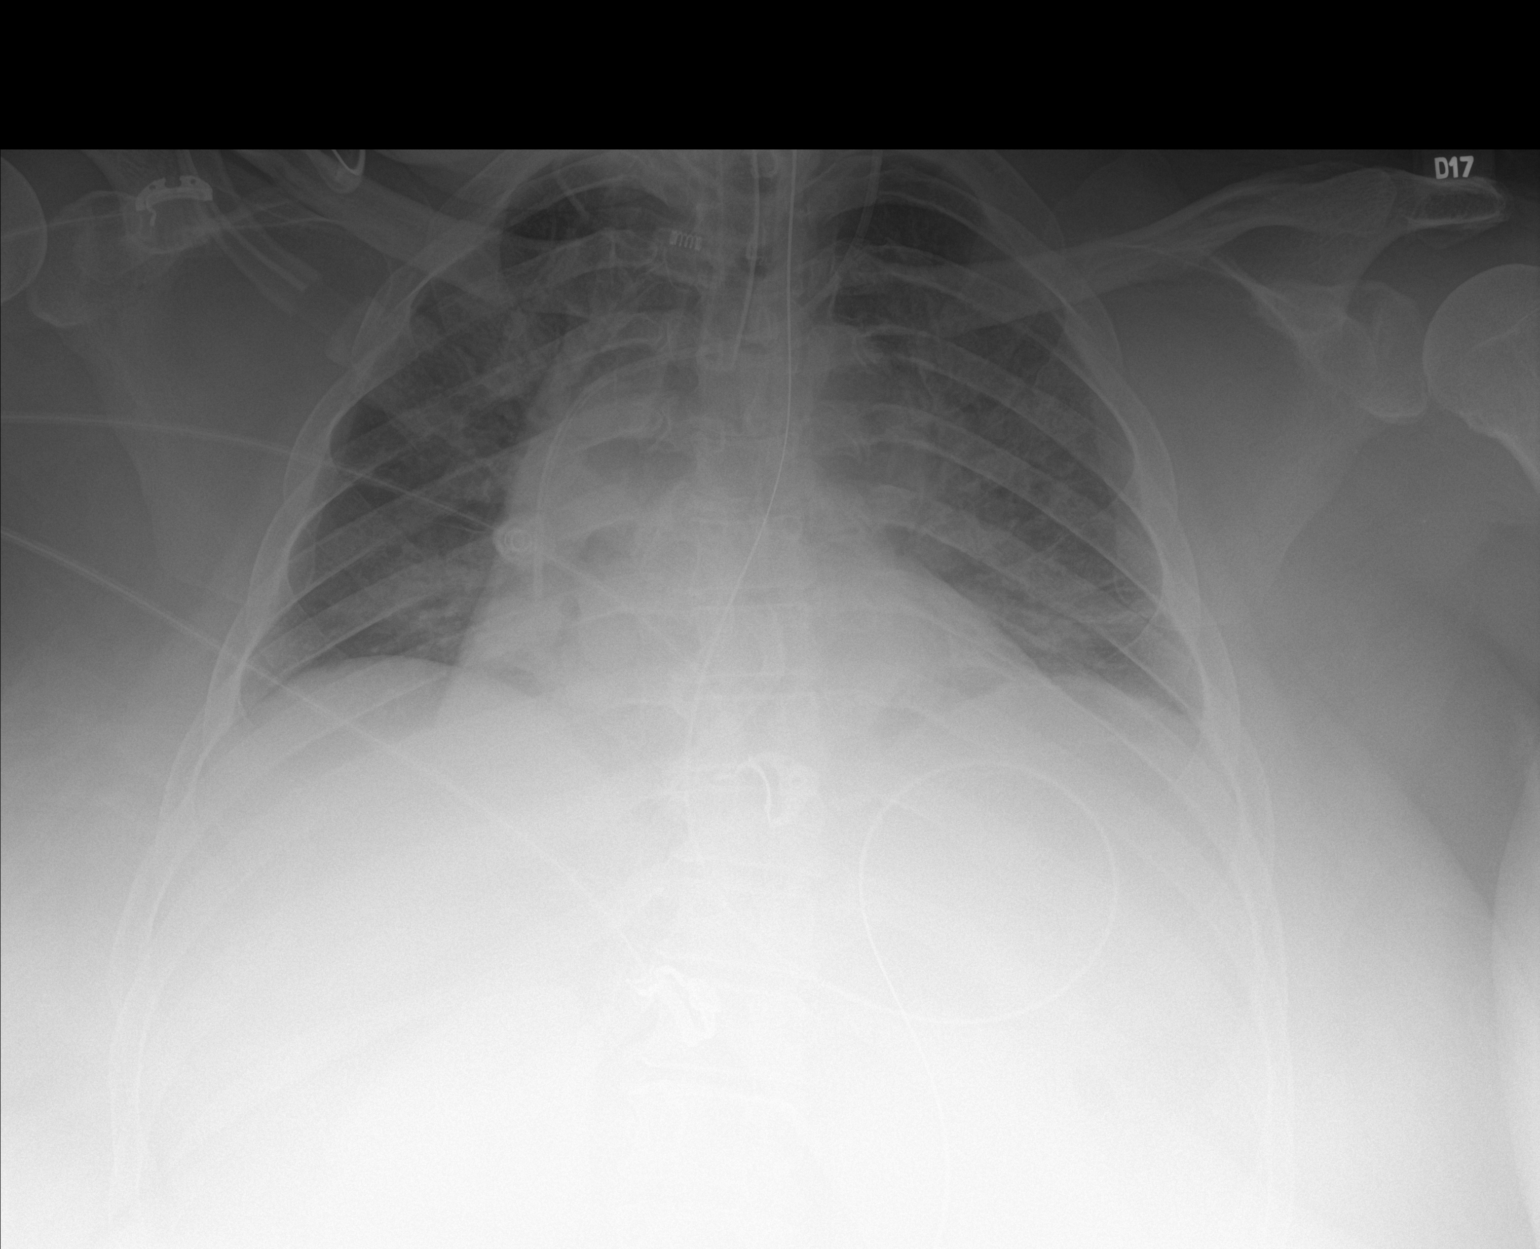

[1 of 1 positions shown; findings below may reference images not displayed]

FINDINGS: Endotracheal tube is approximately 2 cm above the carina. Enteric
tube passes into the stomach with tip out of field of view. Left IJ
central line tip overlies cavoatrial junction. Low lung volumes.
Persistent patchy density bilaterally with improvement. No
significant pleural effusion. No pneumothorax. Cardiomediastinal
contours are similar.
IMPRESSION: Lines and tubes as above. No pneumothorax. Improving bilateral
pulmonary opacities.
